# Patient Record
Sex: Male | Born: 1955 | Race: White | Hispanic: No | Marital: Single | Smoking: Never smoker
Health system: Southern US, Community
[De-identification: ages and names within clinical notes are randomized; demographics above are authoritative.]

## PROBLEM LIST (undated history)

## (undated) DIAGNOSIS — G8250 Quadriplegia, unspecified: Secondary | ICD-10-CM

## (undated) DIAGNOSIS — E46 Unspecified protein-calorie malnutrition: Secondary | ICD-10-CM

## (undated) DIAGNOSIS — E119 Type 2 diabetes mellitus without complications: Secondary | ICD-10-CM

## (undated) DIAGNOSIS — I10 Essential (primary) hypertension: Secondary | ICD-10-CM

## (undated) DIAGNOSIS — R601 Generalized edema: Secondary | ICD-10-CM

## (undated) DIAGNOSIS — I509 Heart failure, unspecified: Secondary | ICD-10-CM

## (undated) DIAGNOSIS — E785 Hyperlipidemia, unspecified: Secondary | ICD-10-CM

## (undated) DIAGNOSIS — J9690 Respiratory failure, unspecified, unspecified whether with hypoxia or hypercapnia: Secondary | ICD-10-CM

## (undated) HISTORY — DX: Essential (primary) hypertension: I10

## (undated) HISTORY — DX: Unspecified protein-calorie malnutrition: E46

## (undated) HISTORY — DX: Quadriplegia, unspecified: G82.50

## (undated) HISTORY — DX: Hyperlipidemia, unspecified: E78.5

## (undated) HISTORY — DX: Type 2 diabetes mellitus without complications: E11.9

## (undated) HISTORY — DX: Heart failure, unspecified: I50.9

## (undated) HISTORY — DX: Respiratory failure, unspecified, unspecified whether with hypoxia or hypercapnia: J96.90

## (undated) HISTORY — DX: Generalized edema: R60.1

---

## 2015-03-14 DIAGNOSIS — N401 Enlarged prostate with lower urinary tract symptoms: Secondary | ICD-10-CM | POA: Diagnosis not present

## 2015-03-14 DIAGNOSIS — E119 Type 2 diabetes mellitus without complications: Secondary | ICD-10-CM | POA: Diagnosis not present

## 2015-03-14 DIAGNOSIS — E785 Hyperlipidemia, unspecified: Secondary | ICD-10-CM | POA: Diagnosis not present

## 2015-03-14 DIAGNOSIS — E1142 Type 2 diabetes mellitus with diabetic polyneuropathy: Secondary | ICD-10-CM | POA: Diagnosis not present

## 2015-03-14 DIAGNOSIS — I10 Essential (primary) hypertension: Secondary | ICD-10-CM | POA: Diagnosis not present

## 2015-06-09 DIAGNOSIS — R21 Rash and other nonspecific skin eruption: Secondary | ICD-10-CM | POA: Diagnosis not present

## 2015-06-09 DIAGNOSIS — R0600 Dyspnea, unspecified: Secondary | ICD-10-CM | POA: Diagnosis not present

## 2015-06-09 DIAGNOSIS — M7989 Other specified soft tissue disorders: Secondary | ICD-10-CM | POA: Diagnosis not present

## 2015-06-09 DIAGNOSIS — L03116 Cellulitis of left lower limb: Secondary | ICD-10-CM | POA: Diagnosis not present

## 2015-06-09 DIAGNOSIS — M79662 Pain in left lower leg: Secondary | ICD-10-CM | POA: Diagnosis not present

## 2015-06-16 DIAGNOSIS — R609 Edema, unspecified: Secondary | ICD-10-CM | POA: Diagnosis not present

## 2015-06-16 DIAGNOSIS — I10 Essential (primary) hypertension: Secondary | ICD-10-CM | POA: Diagnosis not present

## 2015-06-16 DIAGNOSIS — L03116 Cellulitis of left lower limb: Secondary | ICD-10-CM | POA: Diagnosis not present

## 2015-06-16 DIAGNOSIS — E1142 Type 2 diabetes mellitus with diabetic polyneuropathy: Secondary | ICD-10-CM | POA: Diagnosis not present

## 2015-06-16 DIAGNOSIS — E119 Type 2 diabetes mellitus without complications: Secondary | ICD-10-CM | POA: Diagnosis not present

## 2015-06-23 DIAGNOSIS — L03116 Cellulitis of left lower limb: Secondary | ICD-10-CM | POA: Diagnosis not present

## 2015-06-23 DIAGNOSIS — I10 Essential (primary) hypertension: Secondary | ICD-10-CM | POA: Diagnosis not present

## 2015-06-23 DIAGNOSIS — E119 Type 2 diabetes mellitus without complications: Secondary | ICD-10-CM | POA: Diagnosis not present

## 2015-06-23 DIAGNOSIS — R609 Edema, unspecified: Secondary | ICD-10-CM | POA: Diagnosis not present

## 2015-07-01 DIAGNOSIS — R609 Edema, unspecified: Secondary | ICD-10-CM | POA: Diagnosis not present

## 2015-07-01 DIAGNOSIS — I10 Essential (primary) hypertension: Secondary | ICD-10-CM | POA: Diagnosis not present

## 2015-07-01 DIAGNOSIS — E119 Type 2 diabetes mellitus without complications: Secondary | ICD-10-CM | POA: Diagnosis not present

## 2015-07-01 DIAGNOSIS — F0391 Unspecified dementia with behavioral disturbance: Secondary | ICD-10-CM | POA: Diagnosis not present

## 2015-07-10 DIAGNOSIS — E1165 Type 2 diabetes mellitus with hyperglycemia: Secondary | ICD-10-CM | POA: Diagnosis not present

## 2015-07-10 DIAGNOSIS — F0391 Unspecified dementia with behavioral disturbance: Secondary | ICD-10-CM | POA: Diagnosis not present

## 2015-07-10 DIAGNOSIS — I10 Essential (primary) hypertension: Secondary | ICD-10-CM | POA: Diagnosis not present

## 2015-07-10 DIAGNOSIS — L03116 Cellulitis of left lower limb: Secondary | ICD-10-CM | POA: Diagnosis not present

## 2015-07-11 DIAGNOSIS — M79605 Pain in left leg: Secondary | ICD-10-CM | POA: Diagnosis not present

## 2015-07-11 DIAGNOSIS — I82402 Acute embolism and thrombosis of unspecified deep veins of left lower extremity: Secondary | ICD-10-CM | POA: Diagnosis not present

## 2015-07-11 DIAGNOSIS — M7989 Other specified soft tissue disorders: Secondary | ICD-10-CM | POA: Diagnosis not present

## 2015-07-16 DIAGNOSIS — R609 Edema, unspecified: Secondary | ICD-10-CM | POA: Diagnosis not present

## 2015-07-16 DIAGNOSIS — F0391 Unspecified dementia with behavioral disturbance: Secondary | ICD-10-CM | POA: Diagnosis not present

## 2015-07-16 DIAGNOSIS — M6281 Muscle weakness (generalized): Secondary | ICD-10-CM | POA: Diagnosis not present

## 2015-07-16 DIAGNOSIS — E1142 Type 2 diabetes mellitus with diabetic polyneuropathy: Secondary | ICD-10-CM | POA: Diagnosis not present

## 2015-07-16 DIAGNOSIS — R2689 Other abnormalities of gait and mobility: Secondary | ICD-10-CM | POA: Diagnosis not present

## 2015-07-16 DIAGNOSIS — E119 Type 2 diabetes mellitus without complications: Secondary | ICD-10-CM | POA: Diagnosis not present

## 2015-07-16 DIAGNOSIS — R279 Unspecified lack of coordination: Secondary | ICD-10-CM | POA: Diagnosis not present

## 2015-07-22 DIAGNOSIS — E1165 Type 2 diabetes mellitus with hyperglycemia: Secondary | ICD-10-CM | POA: Diagnosis not present

## 2015-07-22 DIAGNOSIS — F0391 Unspecified dementia with behavioral disturbance: Secondary | ICD-10-CM | POA: Diagnosis not present

## 2015-07-22 DIAGNOSIS — E1142 Type 2 diabetes mellitus with diabetic polyneuropathy: Secondary | ICD-10-CM | POA: Diagnosis not present

## 2015-07-22 DIAGNOSIS — I10 Essential (primary) hypertension: Secondary | ICD-10-CM | POA: Diagnosis not present

## 2015-07-29 DIAGNOSIS — F0391 Unspecified dementia with behavioral disturbance: Secondary | ICD-10-CM | POA: Diagnosis not present

## 2015-07-29 DIAGNOSIS — E1142 Type 2 diabetes mellitus with diabetic polyneuropathy: Secondary | ICD-10-CM | POA: Diagnosis not present

## 2015-07-29 DIAGNOSIS — M6281 Muscle weakness (generalized): Secondary | ICD-10-CM | POA: Diagnosis not present

## 2015-07-29 DIAGNOSIS — R279 Unspecified lack of coordination: Secondary | ICD-10-CM | POA: Diagnosis not present

## 2015-07-29 DIAGNOSIS — E119 Type 2 diabetes mellitus without complications: Secondary | ICD-10-CM | POA: Diagnosis not present

## 2015-07-29 DIAGNOSIS — R609 Edema, unspecified: Secondary | ICD-10-CM | POA: Diagnosis not present

## 2015-07-29 DIAGNOSIS — R2689 Other abnormalities of gait and mobility: Secondary | ICD-10-CM | POA: Diagnosis not present

## 2015-07-31 DIAGNOSIS — R2689 Other abnormalities of gait and mobility: Secondary | ICD-10-CM | POA: Diagnosis not present

## 2015-07-31 DIAGNOSIS — I10 Essential (primary) hypertension: Secondary | ICD-10-CM | POA: Diagnosis not present

## 2015-07-31 DIAGNOSIS — R609 Edema, unspecified: Secondary | ICD-10-CM | POA: Diagnosis not present

## 2015-07-31 DIAGNOSIS — F419 Anxiety disorder, unspecified: Secondary | ICD-10-CM | POA: Diagnosis not present

## 2015-07-31 DIAGNOSIS — E119 Type 2 diabetes mellitus without complications: Secondary | ICD-10-CM | POA: Diagnosis not present

## 2015-07-31 DIAGNOSIS — F0391 Unspecified dementia with behavioral disturbance: Secondary | ICD-10-CM | POA: Diagnosis not present

## 2015-07-31 DIAGNOSIS — M1990 Unspecified osteoarthritis, unspecified site: Secondary | ICD-10-CM | POA: Diagnosis not present

## 2015-07-31 DIAGNOSIS — E1142 Type 2 diabetes mellitus with diabetic polyneuropathy: Secondary | ICD-10-CM | POA: Diagnosis not present

## 2015-07-31 DIAGNOSIS — Z7984 Long term (current) use of oral hypoglycemic drugs: Secondary | ICD-10-CM | POA: Diagnosis not present

## 2015-07-31 DIAGNOSIS — M6281 Muscle weakness (generalized): Secondary | ICD-10-CM | POA: Diagnosis not present

## 2015-08-01 DIAGNOSIS — M6281 Muscle weakness (generalized): Secondary | ICD-10-CM | POA: Diagnosis not present

## 2015-08-01 DIAGNOSIS — I10 Essential (primary) hypertension: Secondary | ICD-10-CM | POA: Diagnosis not present

## 2015-08-01 DIAGNOSIS — E1142 Type 2 diabetes mellitus with diabetic polyneuropathy: Secondary | ICD-10-CM | POA: Diagnosis not present

## 2015-08-01 DIAGNOSIS — F0391 Unspecified dementia with behavioral disturbance: Secondary | ICD-10-CM | POA: Diagnosis not present

## 2015-08-01 DIAGNOSIS — Z7984 Long term (current) use of oral hypoglycemic drugs: Secondary | ICD-10-CM | POA: Diagnosis not present

## 2015-08-01 DIAGNOSIS — R2689 Other abnormalities of gait and mobility: Secondary | ICD-10-CM | POA: Diagnosis not present

## 2015-08-01 DIAGNOSIS — F419 Anxiety disorder, unspecified: Secondary | ICD-10-CM | POA: Diagnosis not present

## 2015-08-01 DIAGNOSIS — M1990 Unspecified osteoarthritis, unspecified site: Secondary | ICD-10-CM | POA: Diagnosis not present

## 2015-08-01 DIAGNOSIS — E119 Type 2 diabetes mellitus without complications: Secondary | ICD-10-CM | POA: Diagnosis not present

## 2015-08-01 DIAGNOSIS — R609 Edema, unspecified: Secondary | ICD-10-CM | POA: Diagnosis not present

## 2015-08-13 DIAGNOSIS — M6281 Muscle weakness (generalized): Secondary | ICD-10-CM | POA: Diagnosis not present

## 2015-08-13 DIAGNOSIS — R609 Edema, unspecified: Secondary | ICD-10-CM | POA: Diagnosis not present

## 2015-08-13 DIAGNOSIS — F419 Anxiety disorder, unspecified: Secondary | ICD-10-CM | POA: Diagnosis not present

## 2015-08-13 DIAGNOSIS — M1990 Unspecified osteoarthritis, unspecified site: Secondary | ICD-10-CM | POA: Diagnosis not present

## 2015-08-13 DIAGNOSIS — Z7984 Long term (current) use of oral hypoglycemic drugs: Secondary | ICD-10-CM | POA: Diagnosis not present

## 2015-08-13 DIAGNOSIS — R2689 Other abnormalities of gait and mobility: Secondary | ICD-10-CM | POA: Diagnosis not present

## 2015-08-13 DIAGNOSIS — F0391 Unspecified dementia with behavioral disturbance: Secondary | ICD-10-CM | POA: Diagnosis not present

## 2015-08-13 DIAGNOSIS — I10 Essential (primary) hypertension: Secondary | ICD-10-CM | POA: Diagnosis not present

## 2015-08-13 DIAGNOSIS — E1142 Type 2 diabetes mellitus with diabetic polyneuropathy: Secondary | ICD-10-CM | POA: Diagnosis not present

## 2015-08-13 DIAGNOSIS — E119 Type 2 diabetes mellitus without complications: Secondary | ICD-10-CM | POA: Diagnosis not present

## 2015-08-18 DIAGNOSIS — Z0001 Encounter for general adult medical examination with abnormal findings: Secondary | ICD-10-CM | POA: Diagnosis not present

## 2015-08-18 DIAGNOSIS — E1142 Type 2 diabetes mellitus with diabetic polyneuropathy: Secondary | ICD-10-CM | POA: Diagnosis not present

## 2015-08-18 DIAGNOSIS — Z1211 Encounter for screening for malignant neoplasm of colon: Secondary | ICD-10-CM | POA: Diagnosis not present

## 2015-08-18 DIAGNOSIS — E119 Type 2 diabetes mellitus without complications: Secondary | ICD-10-CM | POA: Diagnosis not present

## 2015-08-18 DIAGNOSIS — R450 Nervousness: Secondary | ICD-10-CM | POA: Diagnosis not present

## 2015-08-18 DIAGNOSIS — I10 Essential (primary) hypertension: Secondary | ICD-10-CM | POA: Diagnosis not present

## 2015-08-18 DIAGNOSIS — F329 Major depressive disorder, single episode, unspecified: Secondary | ICD-10-CM | POA: Diagnosis not present

## 2015-08-29 DIAGNOSIS — I10 Essential (primary) hypertension: Secondary | ICD-10-CM | POA: Diagnosis not present

## 2015-08-29 DIAGNOSIS — Z008 Encounter for other general examination: Secondary | ICD-10-CM | POA: Diagnosis not present

## 2015-08-29 DIAGNOSIS — E1142 Type 2 diabetes mellitus with diabetic polyneuropathy: Secondary | ICD-10-CM | POA: Diagnosis not present

## 2015-08-29 DIAGNOSIS — F0391 Unspecified dementia with behavioral disturbance: Secondary | ICD-10-CM | POA: Diagnosis not present

## 2015-08-29 DIAGNOSIS — E119 Type 2 diabetes mellitus without complications: Secondary | ICD-10-CM | POA: Diagnosis not present

## 2015-09-01 DIAGNOSIS — Z7984 Long term (current) use of oral hypoglycemic drugs: Secondary | ICD-10-CM | POA: Diagnosis not present

## 2015-09-01 DIAGNOSIS — F0391 Unspecified dementia with behavioral disturbance: Secondary | ICD-10-CM | POA: Diagnosis not present

## 2015-09-01 DIAGNOSIS — R2689 Other abnormalities of gait and mobility: Secondary | ICD-10-CM | POA: Diagnosis not present

## 2015-09-01 DIAGNOSIS — R609 Edema, unspecified: Secondary | ICD-10-CM | POA: Diagnosis not present

## 2015-09-01 DIAGNOSIS — E1142 Type 2 diabetes mellitus with diabetic polyneuropathy: Secondary | ICD-10-CM | POA: Diagnosis not present

## 2015-09-01 DIAGNOSIS — I10 Essential (primary) hypertension: Secondary | ICD-10-CM | POA: Diagnosis not present

## 2015-09-01 DIAGNOSIS — E119 Type 2 diabetes mellitus without complications: Secondary | ICD-10-CM | POA: Diagnosis not present

## 2015-09-01 DIAGNOSIS — M1990 Unspecified osteoarthritis, unspecified site: Secondary | ICD-10-CM | POA: Diagnosis not present

## 2015-09-01 DIAGNOSIS — F419 Anxiety disorder, unspecified: Secondary | ICD-10-CM | POA: Diagnosis not present

## 2015-09-01 DIAGNOSIS — M6281 Muscle weakness (generalized): Secondary | ICD-10-CM | POA: Diagnosis not present

## 2015-09-03 DIAGNOSIS — F0391 Unspecified dementia with behavioral disturbance: Secondary | ICD-10-CM | POA: Diagnosis not present

## 2015-09-03 DIAGNOSIS — Z7984 Long term (current) use of oral hypoglycemic drugs: Secondary | ICD-10-CM | POA: Diagnosis not present

## 2015-09-03 DIAGNOSIS — E119 Type 2 diabetes mellitus without complications: Secondary | ICD-10-CM | POA: Diagnosis not present

## 2015-09-03 DIAGNOSIS — I10 Essential (primary) hypertension: Secondary | ICD-10-CM | POA: Diagnosis not present

## 2015-09-03 DIAGNOSIS — F419 Anxiety disorder, unspecified: Secondary | ICD-10-CM | POA: Diagnosis not present

## 2015-09-03 DIAGNOSIS — R609 Edema, unspecified: Secondary | ICD-10-CM | POA: Diagnosis not present

## 2015-09-03 DIAGNOSIS — R2689 Other abnormalities of gait and mobility: Secondary | ICD-10-CM | POA: Diagnosis not present

## 2015-09-03 DIAGNOSIS — E1142 Type 2 diabetes mellitus with diabetic polyneuropathy: Secondary | ICD-10-CM | POA: Diagnosis not present

## 2015-09-03 DIAGNOSIS — M1990 Unspecified osteoarthritis, unspecified site: Secondary | ICD-10-CM | POA: Diagnosis not present

## 2015-09-03 DIAGNOSIS — M6281 Muscle weakness (generalized): Secondary | ICD-10-CM | POA: Diagnosis not present

## 2015-10-21 DIAGNOSIS — R1030 Lower abdominal pain, unspecified: Secondary | ICD-10-CM | POA: Diagnosis not present

## 2015-10-21 DIAGNOSIS — E119 Type 2 diabetes mellitus without complications: Secondary | ICD-10-CM | POA: Diagnosis not present

## 2015-10-21 DIAGNOSIS — R110 Nausea: Secondary | ICD-10-CM | POA: Diagnosis not present

## 2015-10-21 DIAGNOSIS — E559 Vitamin D deficiency, unspecified: Secondary | ICD-10-CM | POA: Diagnosis not present

## 2015-10-21 DIAGNOSIS — R1084 Generalized abdominal pain: Secondary | ICD-10-CM | POA: Diagnosis not present

## 2015-10-21 DIAGNOSIS — E785 Hyperlipidemia, unspecified: Secondary | ICD-10-CM | POA: Diagnosis not present

## 2015-10-21 DIAGNOSIS — E860 Dehydration: Secondary | ICD-10-CM | POA: Diagnosis not present

## 2015-10-21 DIAGNOSIS — I10 Essential (primary) hypertension: Secondary | ICD-10-CM | POA: Diagnosis not present

## 2015-10-21 DIAGNOSIS — R5381 Other malaise: Secondary | ICD-10-CM | POA: Diagnosis not present

## 2015-11-21 DIAGNOSIS — E119 Type 2 diabetes mellitus without complications: Secondary | ICD-10-CM | POA: Diagnosis not present

## 2015-11-21 DIAGNOSIS — S91002S Unspecified open wound, left ankle, sequela: Secondary | ICD-10-CM | POA: Diagnosis not present

## 2015-11-21 DIAGNOSIS — E860 Dehydration: Secondary | ICD-10-CM | POA: Diagnosis not present

## 2015-11-21 DIAGNOSIS — I10 Essential (primary) hypertension: Secondary | ICD-10-CM | POA: Diagnosis not present

## 2015-11-25 DIAGNOSIS — E1142 Type 2 diabetes mellitus with diabetic polyneuropathy: Secondary | ICD-10-CM | POA: Diagnosis not present

## 2015-11-25 DIAGNOSIS — E119 Type 2 diabetes mellitus without complications: Secondary | ICD-10-CM | POA: Diagnosis not present

## 2015-11-25 DIAGNOSIS — I10 Essential (primary) hypertension: Secondary | ICD-10-CM | POA: Diagnosis not present

## 2015-11-25 DIAGNOSIS — R5381 Other malaise: Secondary | ICD-10-CM | POA: Diagnosis not present

## 2015-12-03 DIAGNOSIS — M6281 Muscle weakness (generalized): Secondary | ICD-10-CM | POA: Diagnosis not present

## 2015-12-03 DIAGNOSIS — E669 Obesity, unspecified: Secondary | ICD-10-CM | POA: Diagnosis not present

## 2015-12-03 DIAGNOSIS — S81812D Laceration without foreign body, left lower leg, subsequent encounter: Secondary | ICD-10-CM | POA: Diagnosis not present

## 2015-12-03 DIAGNOSIS — E119 Type 2 diabetes mellitus without complications: Secondary | ICD-10-CM | POA: Diagnosis not present

## 2015-12-03 DIAGNOSIS — F0390 Unspecified dementia without behavioral disturbance: Secondary | ICD-10-CM | POA: Diagnosis not present

## 2015-12-03 DIAGNOSIS — R609 Edema, unspecified: Secondary | ICD-10-CM | POA: Diagnosis not present

## 2015-12-03 DIAGNOSIS — I10 Essential (primary) hypertension: Secondary | ICD-10-CM | POA: Diagnosis not present

## 2015-12-03 DIAGNOSIS — Z794 Long term (current) use of insulin: Secondary | ICD-10-CM | POA: Diagnosis not present

## 2015-12-04 DIAGNOSIS — Z794 Long term (current) use of insulin: Secondary | ICD-10-CM | POA: Diagnosis not present

## 2015-12-04 DIAGNOSIS — E119 Type 2 diabetes mellitus without complications: Secondary | ICD-10-CM | POA: Diagnosis not present

## 2015-12-04 DIAGNOSIS — I10 Essential (primary) hypertension: Secondary | ICD-10-CM | POA: Diagnosis not present

## 2015-12-04 DIAGNOSIS — R609 Edema, unspecified: Secondary | ICD-10-CM | POA: Diagnosis not present

## 2015-12-04 DIAGNOSIS — M6281 Muscle weakness (generalized): Secondary | ICD-10-CM | POA: Diagnosis not present

## 2015-12-04 DIAGNOSIS — S81812D Laceration without foreign body, left lower leg, subsequent encounter: Secondary | ICD-10-CM | POA: Diagnosis not present

## 2015-12-04 DIAGNOSIS — E669 Obesity, unspecified: Secondary | ICD-10-CM | POA: Diagnosis not present

## 2015-12-04 DIAGNOSIS — F0390 Unspecified dementia without behavioral disturbance: Secondary | ICD-10-CM | POA: Diagnosis not present

## 2015-12-19 DIAGNOSIS — E119 Type 2 diabetes mellitus without complications: Secondary | ICD-10-CM | POA: Diagnosis not present

## 2015-12-19 DIAGNOSIS — Z202 Contact with and (suspected) exposure to infections with a predominantly sexual mode of transmission: Secondary | ICD-10-CM | POA: Diagnosis not present

## 2015-12-19 DIAGNOSIS — I10 Essential (primary) hypertension: Secondary | ICD-10-CM | POA: Diagnosis not present

## 2015-12-19 DIAGNOSIS — E785 Hyperlipidemia, unspecified: Secondary | ICD-10-CM | POA: Diagnosis not present

## 2015-12-19 DIAGNOSIS — E1142 Type 2 diabetes mellitus with diabetic polyneuropathy: Secondary | ICD-10-CM | POA: Diagnosis not present

## 2015-12-19 DIAGNOSIS — R5381 Other malaise: Secondary | ICD-10-CM | POA: Diagnosis not present

## 2015-12-24 DIAGNOSIS — R197 Diarrhea, unspecified: Secondary | ICD-10-CM | POA: Diagnosis not present

## 2016-01-05 DIAGNOSIS — E669 Obesity, unspecified: Secondary | ICD-10-CM | POA: Diagnosis not present

## 2016-01-05 DIAGNOSIS — E119 Type 2 diabetes mellitus without complications: Secondary | ICD-10-CM | POA: Diagnosis not present

## 2016-01-05 DIAGNOSIS — Z794 Long term (current) use of insulin: Secondary | ICD-10-CM | POA: Diagnosis not present

## 2016-01-05 DIAGNOSIS — I10 Essential (primary) hypertension: Secondary | ICD-10-CM | POA: Diagnosis not present

## 2016-01-05 DIAGNOSIS — M6281 Muscle weakness (generalized): Secondary | ICD-10-CM | POA: Diagnosis not present

## 2016-01-05 DIAGNOSIS — F0390 Unspecified dementia without behavioral disturbance: Secondary | ICD-10-CM | POA: Diagnosis not present

## 2016-01-05 DIAGNOSIS — S81812D Laceration without foreign body, left lower leg, subsequent encounter: Secondary | ICD-10-CM | POA: Diagnosis not present

## 2016-01-05 DIAGNOSIS — R609 Edema, unspecified: Secondary | ICD-10-CM | POA: Diagnosis not present

## 2016-02-25 DIAGNOSIS — E1142 Type 2 diabetes mellitus with diabetic polyneuropathy: Secondary | ICD-10-CM | POA: Diagnosis not present

## 2016-02-25 DIAGNOSIS — E785 Hyperlipidemia, unspecified: Secondary | ICD-10-CM | POA: Diagnosis not present

## 2016-02-25 DIAGNOSIS — E119 Type 2 diabetes mellitus without complications: Secondary | ICD-10-CM | POA: Diagnosis not present

## 2016-02-25 DIAGNOSIS — Z23 Encounter for immunization: Secondary | ICD-10-CM | POA: Diagnosis not present

## 2016-02-25 DIAGNOSIS — I10 Essential (primary) hypertension: Secondary | ICD-10-CM | POA: Diagnosis not present

## 2016-12-14 ENCOUNTER — Other Ambulatory Visit (HOSPITAL_COMMUNITY): Payer: Medicaid Other

## 2016-12-14 ENCOUNTER — Inpatient Hospital Stay
Admission: RE | Admit: 2016-12-14 | Discharge: 2017-01-05 | Disposition: A | Discharge status: Skilled Nursing Facility | Payer: Medicaid Other | Source: Other Acute Inpatient Hospital | Attending: Internal Medicine | Admitting: Internal Medicine

## 2016-12-14 DIAGNOSIS — Z4659 Encounter for fitting and adjustment of other gastrointestinal appliance and device: Secondary | ICD-10-CM

## 2016-12-14 DIAGNOSIS — Z931 Gastrostomy status: Secondary | ICD-10-CM

## 2016-12-14 DIAGNOSIS — Z0189 Encounter for other specified special examinations: Secondary | ICD-10-CM

## 2016-12-14 DIAGNOSIS — J9690 Respiratory failure, unspecified, unspecified whether with hypoxia or hypercapnia: Secondary | ICD-10-CM

## 2016-12-14 LAB — MAGNESIUM: MAGNESIUM: 2.4 mg/dL (ref 1.7–2.4)

## 2016-12-15 LAB — COMPREHENSIVE METABOLIC PANEL
ALT: 24 U/L (ref 17–63)
ANION GAP: 7 (ref 5–15)
AST: 21 U/L (ref 15–41)
Albumin: 2.8 g/dL — ABNORMAL LOW (ref 3.5–5.0)
Alkaline Phosphatase: 93 U/L (ref 38–126)
BUN: 39 mg/dL — ABNORMAL HIGH (ref 6–20)
CHLORIDE: 100 mmol/L — AB (ref 101–111)
CO2: 30 mmol/L (ref 22–32)
CREATININE: 1.43 mg/dL — AB (ref 0.61–1.24)
Calcium: 9.2 mg/dL (ref 8.9–10.3)
GFR, EST AFRICAN AMERICAN: 59 mL/min — AB (ref >60)
GFR, EST NON AFRICAN AMERICAN: 51 mL/min — AB (ref >60)
Glucose, Bld: 144 mg/dL — ABNORMAL HIGH (ref 65–99)
POTASSIUM: 4.6 mmol/L (ref 3.5–5.1)
SODIUM: 137 mmol/L (ref 135–145)
Total Bilirubin: 0.6 mg/dL (ref 0.3–1.2)
Total Protein: 6.5 g/dL (ref 6.5–8.1)

## 2016-12-15 LAB — PHOSPHORUS: PHOSPHORUS: 3.8 mg/dL (ref 2.5–4.6)

## 2016-12-15 LAB — CBC WITH DIFFERENTIAL/PLATELET
BASOS PCT: 1 %
Basophils Absolute: 0 10*3/uL (ref 0.0–0.1)
EOS ABS: 0.4 10*3/uL (ref 0.0–0.7)
EOS PCT: 4 %
HCT: 36.3 % — ABNORMAL LOW (ref 39.0–52.0)
Hemoglobin: 11.7 g/dL — ABNORMAL LOW (ref 13.0–17.0)
LYMPHS ABS: 2.3 10*3/uL (ref 0.7–4.0)
Lymphocytes Relative: 28 %
MCH: 26.7 pg (ref 26.0–34.0)
MCHC: 32.2 g/dL (ref 30.0–36.0)
MCV: 82.7 fL (ref 78.0–100.0)
Monocytes Absolute: 0.6 10*3/uL (ref 0.1–1.0)
Monocytes Relative: 8 %
NEUTROS PCT: 59 %
Neutro Abs: 5.1 10*3/uL (ref 1.7–7.7)
PLATELETS: 291 10*3/uL (ref 150–400)
RBC: 4.39 MIL/uL (ref 4.22–5.81)
RDW: 15.1 % (ref 11.5–15.5)
WBC: 8.5 10*3/uL (ref 4.0–10.5)

## 2016-12-15 LAB — TSH: TSH: 1.557 u[IU]/mL (ref 0.350–4.500)

## 2016-12-15 LAB — HEMOGLOBIN A1C
HEMOGLOBIN A1C: 6.9 % — AB (ref 4.8–5.6)
MEAN PLASMA GLUCOSE: 151.33 mg/dL

## 2016-12-15 LAB — PROTIME-INR
INR: 1.05
PROTHROMBIN TIME: 13.6 s (ref 11.4–15.2)

## 2016-12-16 ENCOUNTER — Other Ambulatory Visit (HOSPITAL_COMMUNITY): Payer: Medicaid Other

## 2016-12-16 LAB — RENAL FUNCTION PANEL
ANION GAP: 8 (ref 5–15)
Albumin: 2.8 g/dL — ABNORMAL LOW (ref 3.5–5.0)
BUN: 45 mg/dL — ABNORMAL HIGH (ref 6–20)
CALCIUM: 8.9 mg/dL (ref 8.9–10.3)
CO2: 31 mmol/L (ref 22–32)
CREATININE: 1.58 mg/dL — AB (ref 0.61–1.24)
Chloride: 96 mmol/L — ABNORMAL LOW (ref 101–111)
GFR, EST AFRICAN AMERICAN: 52 mL/min — AB (ref >60)
GFR, EST NON AFRICAN AMERICAN: 45 mL/min — AB (ref >60)
Glucose, Bld: 176 mg/dL — ABNORMAL HIGH (ref 65–99)
Phosphorus: 5.9 mg/dL — ABNORMAL HIGH (ref 2.5–4.6)
Potassium: 4.9 mmol/L (ref 3.5–5.1)
SODIUM: 135 mmol/L (ref 135–145)

## 2016-12-16 LAB — CBC
HEMATOCRIT: 37.4 % — AB (ref 39.0–52.0)
HEMOGLOBIN: 12.1 g/dL — AB (ref 13.0–17.0)
MCH: 27.2 pg (ref 26.0–34.0)
MCHC: 32.4 g/dL (ref 30.0–36.0)
MCV: 84 fL (ref 78.0–100.0)
Platelets: 250 10*3/uL (ref 150–400)
RBC: 4.45 MIL/uL (ref 4.22–5.81)
RDW: 15.1 % (ref 11.5–15.5)
WBC: 7.9 10*3/uL (ref 4.0–10.5)

## 2016-12-16 LAB — MAGNESIUM: MAGNESIUM: 2.6 mg/dL — AB (ref 1.7–2.4)

## 2016-12-17 ENCOUNTER — Other Ambulatory Visit (HOSPITAL_COMMUNITY): Payer: Medicaid Other

## 2016-12-17 ENCOUNTER — Encounter: Payer: Self-pay | Admitting: General Surgery

## 2016-12-17 NOTE — Consult Note (Signed)
Chief Complaint: dysphagia  Referring Physician:Dr. Merton Border  Supervising Physician: Daryll Brod  Patient Status: [facility]  HPI: Jordan Villanueva is a 61 y.o. male who was admitted to an outside facility secondary a fall out of the bed and landing on his face resulting in respiratory failure.  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  He has since developed dysphagia.  He has a history of super morbid obesity, DM, HTN, CKD.  He has been admitted to [facility] for rehabilitation.  He has still not passed his swallow evaluation and a request for a gastrostomy tube has been placed.  Past Medical History: Reviewed in paper chart  Past Surgical History: Reviewed in paper chart  Family History: [place]  Social History: Reviewed in paper chart  Allergies: NKDA per paper chart  Medications: Medications reviewed in paper chart  Please HPI for pertinent positives, otherwise he complains of upper extremity weakness, difficulty moving around, but all other systems are negative currently.  Mallampati Score: MD Evaluation Airway: WNL Heart: WNL Abdomen: Other (comments) Abdomen comments: super morbidly obese Chest/ Lungs: WNL ASA  Classification: 3 Mallampati/Airway Score: Four  Physical Exam: Vitals reviewed in paper chart  General: pleasant, super morbidly obese white male who is laying in bed in NAD HEENT: head is normocephalic, atraumatic.  Sclera are noninjected.  PERRL.  Ears and nose without any masses or lesions, but PANDA tube in place.  Mouth is pink and dry Heart: regular, rate, and rhythm.  Normal s1,s2. No obvious murmurs, gallops, or rubs noted.  Lungs: diffuse rhonchi noted.  Respiratory effort nonlabored at least 6L O2 Abd: soft, NT, super morbidly obese, +BS, no masses, hernias palpated Psych: Alert and able to answer some questions, but wife consents for him   Labs: Results for orders placed or  performed during the hospital encounter of 12/14/16 (from the past 48 hour(s))  CBC     Status: Abnormal   Collection Time: 12/16/16  7:09 AM  Result Value Ref Range   WBC 7.9 4.0 - 10.5 K/uL   RBC 4.45 4.22 - 5.81 MIL/uL   Hemoglobin 12.1 (L) 13.0 - 17.0 g/dL   HCT 37.4 (L) 39.0 - 52.0 %   MCV 84.0 78.0 - 100.0 fL   MCH 27.2 26.0 - 34.0 pg   MCHC 32.4 30.0 - 36.0 g/dL   RDW 15.1 11.5 - 15.5 %   Platelets 250 150 - 400 K/uL  Magnesium     Status: Abnormal   Collection Time: 12/16/16  7:09 AM  Result Value Ref Range   Magnesium 2.6 (H) 1.7 - 2.4 mg/dL  Renal function panel     Status: Abnormal   Collection Time: 12/16/16  7:09 AM  Result Value Ref Range   Sodium 135 135 - 145 mmol/L   Potassium 4.9 3.5 - 5.1 mmol/L   Chloride 96 (L) 101 - 111 mmol/L   CO2 31 22 - 32 mmol/L   Glucose, Bld 176 (H) 65 - 99 mg/dL   BUN 45 (H) 6 - 20 mg/dL   Creatinine, Ser 1.58 (H) 0.61 - 1.24 mg/dL   Calcium 8.9 8.9 - 10.3 mg/dL   Phosphorus 5.9 (H) 2.5 - 4.6 mg/dL   Albumin 2.8 (L) 3.5 - 5.0 g/dL   GFR calc non Af Amer 45 (L) >60 mL/min   GFR calc Af Amer 52 (L) >60 mL/min    Comment: (NOTE) The eGFR has  been calculated using the CKD EPI equation. This calculation has not been validated in all clinical situations. eGFR's persistently <60 mL/min signify possible Chronic Kidney Disease.    Anion gap 8 5 - 15    Imaging: Ct Abdomen Wo Contrast  Result Date: 12/17/2016 CLINICAL DATA:  Evaluate anatomy for potential percutaneous gastrostomy tube placement. EXAM: CT ABDOMEN WITHOUT CONTRAST TECHNIQUE: Multidetector CT imaging of the abdomen was performed following the standard protocol without IV contrast. COMPARISON:  CT abdomen pelvis - 01/07/2012 FINDINGS: The lack of intravenous contrast limits the ability to evaluate solid abdominal organs. Examination is further degraded secondary to patient body habitus and quantum mottle artifact. Lower chest: Minimal dependent subpleural atelectasis. No  discrete focal airspace opacities. Borderline cardiomegaly.  No pericardial effusion. Hepatobiliary: Normal hepatic contour. Post cholecystectomy. No ascites. Pancreas: Normal appearance of the pancreas Spleen: Normal appearance of the spleen. Note is made of a small splenule. Adrenals/Urinary Tract: Normal noncontrast appearance the bilateral kidneys. No renal stones. No urinary obstruction. Note is made of an approximately 3.4 x 3.0 cm hypoattenuating (-46 Hounsfield unit) left-sided adrenal myelolipoma, similar to the 12/2011 examination. Normal noncontrast appearance of the right adrenal gland. Stomach/Bowel: The anterior aspect of the mid body of the stomach is well apposed against the ventral wall of the abdomen without interposed liver or colon. Enteric tube tip terminates regional to the [place]. Apparent left lateral abdominal hernia containing portion of the descending colon (image 66, series 3), incompletely imaged. Nonobstructive bowel gas pattern. No pneumoperitoneum, pneumatosis or portal venous gas. Vascular/Lymphatic: Minimal amount of atherosclerotic plaque within a normal caliber abdominal aorta. No bulky retroperitoneal, mesenteric, pelvic or inguinal lymphadenopathy. Other: Left lateral abdominal wall hernia, incompletely evaluated. Musculoskeletal: Severe DDD of L-S1 with disc space height loss, endplate irregularity and sclerosis. 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 IMPRESSION: 1. Gastric anatomy amenable to attempted percutaneous gastrostomy tube placement as clinically indicated. 2. Suspected left lateral abdominal wall hernia containing a portion of the descending colon, incompletely imaged. No definite evidence of enteric obstruction. 3. Benign left adrenal myelolipoma, grossly unchanged compared to the 12/2011 examination. 4.  Aortic Atherosclerosis (ICD10-I70.0). Electronically Signed   By: Sandi Mariscal M.D.   On: 12/17/2016 07:47    Assessment/Plan 1. Dysphagia  secondary to cervical edema  We will plan to proceed with g-tube placement hopefully on Monday.  Labs and vitals have been reviewed.  Phone consent has been obtained from his wife Reg Bircher.    Risks and benefits discussed with the patient including, but not limited to the need for a barium enema during the procedure, bleeding, infection, peritonitis, or damage to adjacent structures. All of the patient's questions were answered, patient is agreeable to proceed. Consent signed and in chart.   Thank you for this interesting consult.  I greatly enjoyed meeting Sang [facility]ount and look forward to participating in their care.  A copy of this report was sent to the requesting provider on this date.  Electronically Signed: Henreitta Cea 12/17/2016, 10:42 AM   I spent a total of 40 Minutes    in face to face in clinical consultation, greater than 50% of which was counseling/coordinating care for dysphagia

## 2016-12-18 LAB — URINALYSIS, ROUTINE W REFLEX MICROSCOPIC
Bilirubin Urine: NEGATIVE
Glucose, UA: 50 mg/dL — AB
Hgb urine dipstick: NEGATIVE
Ketones, ur: NEGATIVE mg/dL
Nitrite: NEGATIVE
PH: 5 (ref 5.0–8.0)
Protein, ur: >=300 mg/dL — AB
SPECIFIC GRAVITY, URINE: 1.015 (ref 1.005–1.030)
SQUAMOUS EPITHELIAL / LPF: NONE SEEN

## 2016-12-19 LAB — BASIC METABOLIC PANEL
ANION GAP: 9 (ref 5–15)
BUN: 70 mg/dL — ABNORMAL HIGH (ref 6–20)
CALCIUM: 9.4 mg/dL (ref 8.9–10.3)
CO2: 31 mmol/L (ref 22–32)
Chloride: 98 mmol/L — ABNORMAL LOW (ref 101–111)
Creatinine, Ser: 1.53 mg/dL — ABNORMAL HIGH (ref 0.61–1.24)
GFR, EST AFRICAN AMERICAN: 54 mL/min — AB (ref >60)
GFR, EST NON AFRICAN AMERICAN: 47 mL/min — AB (ref >60)
GLUCOSE: 196 mg/dL — AB (ref 65–99)
Potassium: 4.3 mmol/L (ref 3.5–5.1)
Sodium: 138 mmol/L (ref 135–145)

## 2016-12-19 LAB — CBC
HCT: 37.6 % — ABNORMAL LOW (ref 39.0–52.0)
Hemoglobin: 12.3 g/dL — ABNORMAL LOW (ref 13.0–17.0)
MCH: 27.6 pg (ref 26.0–34.0)
MCHC: 32.7 g/dL (ref 30.0–36.0)
MCV: 84.5 fL (ref 78.0–100.0)
PLATELETS: 268 10*3/uL (ref 150–400)
RBC: 4.45 MIL/uL (ref 4.22–5.81)
RDW: 14.5 % (ref 11.5–15.5)
WBC: 9.2 10*3/uL (ref 4.0–10.5)

## 2016-12-19 LAB — PHOSPHORUS: Phosphorus: 4.5 mg/dL (ref 2.5–4.6)

## 2016-12-19 LAB — MAGNESIUM: Magnesium: 2.9 mg/dL — ABNORMAL HIGH (ref 1.7–2.4)

## 2016-12-20 ENCOUNTER — Encounter (HOSPITAL_COMMUNITY): Payer: Self-pay | Admitting: Interventional Radiology

## 2016-12-20 ENCOUNTER — Other Ambulatory Visit (HOSPITAL_COMMUNITY): Payer: Medicaid Other

## 2016-12-20 HISTORY — PX: IR GASTROSTOMY TUBE MOD SED: IMG625

## 2016-12-20 MED ORDER — IOPAMIDOL (ISOVUE-300) INJECTION 61%
INTRAVENOUS | Status: AC
  Administered 2016-12-20: 15 mL
  Filled 2016-12-20: qty 50

## 2016-12-20 MED ORDER — LIDOCAINE HCL (PF) 1 % IJ SOLN
INTRAMUSCULAR | Status: AC | PRN
  Administered 2016-12-20: 5 mL

## 2016-12-20 MED ORDER — MIDAZOLAM HCL 2 MG/2ML IJ SOLN
INTRAMUSCULAR | Status: AC
  Filled 2016-12-20: qty 2

## 2016-12-20 MED ORDER — LIDOCAINE HCL 1 % IJ SOLN
INTRAMUSCULAR | Status: AC
  Filled 2016-12-20: qty 20

## 2016-12-20 MED ORDER — FENTANYL CITRATE (PF) 100 MCG/2ML IJ SOLN
INTRAMUSCULAR | Status: AC | PRN
  Administered 2016-12-20: 25 ug via INTRAVENOUS

## 2016-12-20 MED ORDER — FENTANYL CITRATE (PF) 100 MCG/2ML IJ SOLN
INTRAMUSCULAR | Status: AC
  Filled 2016-12-20: qty 2

## 2016-12-20 MED ORDER — GLUCAGON HCL RDNA (DIAGNOSTIC) 1 MG IJ SOLR
INTRAMUSCULAR | Status: AC
  Filled 2016-12-20: qty 1

## 2016-12-20 MED ORDER — MIDAZOLAM HCL 2 MG/2ML IJ SOLN
INTRAMUSCULAR | Status: AC | PRN
  Administered 2016-12-20: 1 mg via INTRAVENOUS

## 2016-12-20 MED ORDER — CEFAZOLIN SODIUM-DEXTROSE 2-4 GM/100ML-% IV SOLN
INTRAVENOUS | Status: AC
  Administered 2016-12-20: 2000 mg
  Filled 2016-12-20: qty 100

## 2016-12-20 NOTE — Sedation Documentation (Signed)
Patient is resting comfortably. No complaints at this time 

## 2016-12-20 NOTE — Sedation Documentation (Signed)
Patient denies pain and is resting comfortably.  

## 2016-12-20 NOTE — Procedures (Signed)
Interventional Radiology Procedure Note  Procedure:  Gastrostomy tube placement  Complications:  None  Estimated Blood Loss:  < 10 mL  20 Fr bumper retention gastrostomy tube placed with tip in body of stomach.  OK to use in 24 hours.  Shannan Garfinkel T. Chad Tiznado, M.D Pager:  [phone]    

## 2016-12-20 NOTE — Sedation Documentation (Signed)
Vital signs stable. 

## 2016-12-21 LAB — CBC
HCT: 38.2 % — ABNORMAL LOW (ref 39.0–52.0)
Hemoglobin: 12.2 g/dL — ABNORMAL LOW (ref 13.0–17.0)
MCH: 27.4 pg (ref 26.0–34.0)
MCHC: 31.9 g/dL (ref 30.0–36.0)
MCV: 85.7 fL (ref 78.0–100.0)
PLATELETS: 282 10*3/uL (ref 150–400)
RBC: 4.46 MIL/uL (ref 4.22–5.81)
RDW: 15 % (ref 11.5–15.5)
WBC: 9.4 10*3/uL (ref 4.0–10.5)

## 2016-12-21 LAB — BASIC METABOLIC PANEL
ANION GAP: 7 (ref 5–15)
BUN: 56 mg/dL — AB (ref 6–20)
CALCIUM: 9.8 mg/dL (ref 8.9–10.3)
CO2: 33 mmol/L — ABNORMAL HIGH (ref 22–32)
Chloride: 102 mmol/L (ref 101–111)
Creatinine, Ser: 1.39 mg/dL — ABNORMAL HIGH (ref 0.61–1.24)
GFR calc Af Amer: >60 mL/min (ref >60)
GFR, EST NON AFRICAN AMERICAN: 52 mL/min — AB (ref >60)
GLUCOSE: 187 mg/dL — AB (ref 65–99)
Potassium: 4.3 mmol/L (ref 3.5–5.1)
SODIUM: 142 mmol/L (ref 135–145)

## 2016-12-21 LAB — PHOSPHORUS: Phosphorus: 3.6 mg/dL (ref 2.5–4.6)

## 2016-12-21 LAB — URINE CULTURE

## 2016-12-21 LAB — MAGNESIUM: MAGNESIUM: 2.5 mg/dL — AB (ref 1.7–2.4)

## 2016-12-24 LAB — BASIC METABOLIC PANEL
ANION GAP: 8 (ref 5–15)
BUN: 69 mg/dL — ABNORMAL HIGH (ref 6–20)
CO2: 33 mmol/L — AB (ref 22–32)
Calcium: 9.6 mg/dL (ref 8.9–10.3)
Chloride: 105 mmol/L (ref 101–111)
Creatinine, Ser: 1.66 mg/dL — ABNORMAL HIGH (ref 0.61–1.24)
GFR calc Af Amer: 49 mL/min — ABNORMAL LOW (ref >60)
GFR calc non Af Amer: 42 mL/min — ABNORMAL LOW (ref >60)
GLUCOSE: 180 mg/dL — AB (ref 65–99)
POTASSIUM: 4.4 mmol/L (ref 3.5–5.1)
Sodium: 146 mmol/L — ABNORMAL HIGH (ref 135–145)

## 2016-12-25 LAB — C DIFFICILE QUICK SCREEN W PCR REFLEX
C DIFFICILE (CDIFF) TOXIN: NEGATIVE
C DIFFICLE (CDIFF) ANTIGEN: NEGATIVE
C Diff interpretation: NOT DETECTED

## 2016-12-26 LAB — BASIC METABOLIC PANEL
ANION GAP: 10 (ref 5–15)
BUN: 63 mg/dL — ABNORMAL HIGH (ref 6–20)
CHLORIDE: 106 mmol/L (ref 101–111)
CO2: 31 mmol/L (ref 22–32)
CREATININE: 1.42 mg/dL — AB (ref 0.61–1.24)
Calcium: 9.7 mg/dL (ref 8.9–10.3)
GFR calc non Af Amer: 51 mL/min — ABNORMAL LOW (ref >60)
GFR, EST AFRICAN AMERICAN: 59 mL/min — AB (ref >60)
Glucose, Bld: 169 mg/dL — ABNORMAL HIGH (ref 65–99)
POTASSIUM: 4.1 mmol/L (ref 3.5–5.1)
Sodium: 147 mmol/L — ABNORMAL HIGH (ref 135–145)

## 2016-12-28 LAB — BASIC METABOLIC PANEL
Anion gap: 8 (ref 5–15)
BUN: 51 mg/dL — AB (ref 6–20)
CALCIUM: 9.8 mg/dL (ref 8.9–10.3)
CO2: 29 mmol/L (ref 22–32)
CREATININE: 1.27 mg/dL — AB (ref 0.61–1.24)
Chloride: 108 mmol/L (ref 101–111)
GFR, EST NON AFRICAN AMERICAN: 58 mL/min — AB (ref >60)
Glucose, Bld: 165 mg/dL — ABNORMAL HIGH (ref 65–99)
Potassium: 5.2 mmol/L — ABNORMAL HIGH (ref 3.5–5.1)
SODIUM: 145 mmol/L (ref 135–145)

## 2016-12-28 LAB — CBC
HCT: 37.1 % — ABNORMAL LOW (ref 39.0–52.0)
Hemoglobin: 11.8 g/dL — ABNORMAL LOW (ref 13.0–17.0)
MCH: 27.8 pg (ref 26.0–34.0)
MCHC: 31.8 g/dL (ref 30.0–36.0)
MCV: 87.5 fL (ref 78.0–100.0)
PLATELETS: 219 10*3/uL (ref 150–400)
RBC: 4.24 MIL/uL (ref 4.22–5.81)
RDW: 14.8 % (ref 11.5–15.5)
WBC: 8.8 10*3/uL (ref 4.0–10.5)

## 2016-12-28 LAB — PHOSPHORUS: PHOSPHORUS: 4 mg/dL (ref 2.5–4.6)

## 2016-12-28 LAB — MAGNESIUM: MAGNESIUM: 2.4 mg/dL (ref 1.7–2.4)

## 2016-12-29 LAB — BASIC METABOLIC PANEL
Anion gap: 7 (ref 5–15)
BUN: 52 mg/dL — ABNORMAL HIGH (ref 6–20)
CALCIUM: 9.7 mg/dL (ref 8.9–10.3)
CHLORIDE: 106 mmol/L (ref 101–111)
CO2: 32 mmol/L (ref 22–32)
CREATININE: 1.37 mg/dL — AB (ref 0.61–1.24)
GFR, EST NON AFRICAN AMERICAN: 53 mL/min — AB (ref >60)
Glucose, Bld: 137 mg/dL — ABNORMAL HIGH (ref 65–99)
Potassium: 4.8 mmol/L (ref 3.5–5.1)
SODIUM: 145 mmol/L (ref 135–145)

## 2017-01-04 LAB — CBC
HCT: 36.5 % — ABNORMAL LOW (ref 39.0–52.0)
Hemoglobin: 11.5 g/dL — ABNORMAL LOW (ref 13.0–17.0)
MCH: 27.7 pg (ref 26.0–34.0)
MCHC: 31.5 g/dL (ref 30.0–36.0)
MCV: 88 fL (ref 78.0–100.0)
PLATELETS: 219 10*3/uL (ref 150–400)
RBC: 4.15 MIL/uL — AB (ref 4.22–5.81)
RDW: 15.3 % (ref 11.5–15.5)
WBC: 8.5 10*3/uL (ref 4.0–10.5)

## 2017-01-04 LAB — BASIC METABOLIC PANEL
ANION GAP: 7 (ref 5–15)
BUN: 56 mg/dL — ABNORMAL HIGH (ref 6–20)
CO2: 31 mmol/L (ref 22–32)
Calcium: 10.2 mg/dL (ref 8.9–10.3)
Chloride: 106 mmol/L (ref 101–111)
Creatinine, Ser: 1.45 mg/dL — ABNORMAL HIGH (ref 0.61–1.24)
GFR calc Af Amer: 58 mL/min — ABNORMAL LOW (ref >60)
GFR, EST NON AFRICAN AMERICAN: 50 mL/min — AB (ref >60)
GLUCOSE: 170 mg/dL — AB (ref 65–99)
POTASSIUM: 4.6 mmol/L (ref 3.5–5.1)
SODIUM: 144 mmol/L (ref 135–145)

## 2017-01-06 ENCOUNTER — Encounter: Payer: Self-pay | Admitting: Adult Health

## 2017-01-06 ENCOUNTER — Non-Acute Institutional Stay (SKILLED_NURSING_FACILITY): Payer: Medicare Other | Admitting: Adult Health

## 2017-01-06 ENCOUNTER — Other Ambulatory Visit: Payer: Self-pay

## 2017-01-06 ENCOUNTER — Emergency Department (HOSPITAL_COMMUNITY): Payer: Medicare Other

## 2017-01-06 ENCOUNTER — Inpatient Hospital Stay (HOSPITAL_COMMUNITY)
Admission: EM | Admit: 2017-01-06 | Discharge: 2017-01-12 | DRG: 291 | Disposition: A | Discharge status: Skilled Nursing Facility | Payer: Medicare Other | Attending: Internal Medicine | Admitting: Internal Medicine

## 2017-01-06 ENCOUNTER — Encounter (HOSPITAL_COMMUNITY): Payer: Self-pay | Admitting: Emergency Medicine

## 2017-01-06 DIAGNOSIS — E1169 Type 2 diabetes mellitus with other specified complication: Secondary | ICD-10-CM

## 2017-01-06 DIAGNOSIS — E785 Hyperlipidemia, unspecified: Secondary | ICD-10-CM | POA: Diagnosis present

## 2017-01-06 DIAGNOSIS — H548 Legal blindness, as defined in USA: Secondary | ICD-10-CM | POA: Diagnosis present

## 2017-01-06 DIAGNOSIS — S24101A Unspecified injury at T1 level of thoracic spinal cord, initial encounter: Secondary | ICD-10-CM | POA: Diagnosis present

## 2017-01-06 DIAGNOSIS — N179 Acute kidney failure, unspecified: Secondary | ICD-10-CM | POA: Diagnosis present

## 2017-01-06 DIAGNOSIS — R601 Generalized edema: Secondary | ICD-10-CM

## 2017-01-06 DIAGNOSIS — I5032 Chronic diastolic (congestive) heart failure: Secondary | ICD-10-CM | POA: Diagnosis not present

## 2017-01-06 DIAGNOSIS — E46 Unspecified protein-calorie malnutrition: Secondary | ICD-10-CM | POA: Diagnosis present

## 2017-01-06 DIAGNOSIS — Z794 Long term (current) use of insulin: Secondary | ICD-10-CM

## 2017-01-06 DIAGNOSIS — N183 Chronic kidney disease, stage 3 (moderate): Secondary | ICD-10-CM | POA: Diagnosis present

## 2017-01-06 DIAGNOSIS — G8250 Quadriplegia, unspecified: Secondary | ICD-10-CM | POA: Diagnosis present

## 2017-01-06 DIAGNOSIS — E1122 Type 2 diabetes mellitus with diabetic chronic kidney disease: Secondary | ICD-10-CM | POA: Diagnosis present

## 2017-01-06 DIAGNOSIS — I130 Hypertensive heart and chronic kidney disease with heart failure and stage 1 through stage 4 chronic kidney disease, or unspecified chronic kidney disease: Secondary | ICD-10-CM | POA: Diagnosis not present

## 2017-01-06 DIAGNOSIS — R0602 Shortness of breath: Secondary | ICD-10-CM

## 2017-01-06 DIAGNOSIS — E669 Obesity, unspecified: Secondary | ICD-10-CM | POA: Diagnosis present

## 2017-01-06 DIAGNOSIS — Z981 Arthrodesis status: Secondary | ICD-10-CM

## 2017-01-06 DIAGNOSIS — E778 Other disorders of glycoprotein metabolism: Secondary | ICD-10-CM | POA: Diagnosis present

## 2017-01-06 DIAGNOSIS — R4702 Dysphasia: Secondary | ICD-10-CM | POA: Diagnosis present

## 2017-01-06 DIAGNOSIS — G9341 Metabolic encephalopathy: Secondary | ICD-10-CM | POA: Diagnosis present

## 2017-01-06 DIAGNOSIS — Z7982 Long term (current) use of aspirin: Secondary | ICD-10-CM

## 2017-01-06 DIAGNOSIS — E8770 Fluid overload, unspecified: Secondary | ICD-10-CM

## 2017-01-06 DIAGNOSIS — S24101S Unspecified injury at T1 level of thoracic spinal cord, sequela: Secondary | ICD-10-CM | POA: Diagnosis not present

## 2017-01-06 DIAGNOSIS — K219 Gastro-esophageal reflux disease without esophagitis: Secondary | ICD-10-CM | POA: Diagnosis not present

## 2017-01-06 DIAGNOSIS — F0390 Unspecified dementia without behavioral disturbance: Secondary | ICD-10-CM | POA: Diagnosis present

## 2017-01-06 DIAGNOSIS — Z79899 Other long term (current) drug therapy: Secondary | ICD-10-CM

## 2017-01-06 DIAGNOSIS — K5909 Other constipation: Secondary | ICD-10-CM | POA: Diagnosis not present

## 2017-01-06 DIAGNOSIS — F329 Major depressive disorder, single episode, unspecified: Secondary | ICD-10-CM | POA: Diagnosis present

## 2017-01-06 DIAGNOSIS — J9690 Respiratory failure, unspecified, unspecified whether with hypoxia or hypercapnia: Secondary | ICD-10-CM | POA: Diagnosis present

## 2017-01-06 DIAGNOSIS — I509 Heart failure, unspecified: Secondary | ICD-10-CM

## 2017-01-06 DIAGNOSIS — Z931 Gastrostomy status: Secondary | ICD-10-CM

## 2017-01-06 DIAGNOSIS — E119 Type 2 diabetes mellitus without complications: Secondary | ICD-10-CM | POA: Diagnosis present

## 2017-01-06 DIAGNOSIS — R0600 Dyspnea, unspecified: Secondary | ICD-10-CM

## 2017-01-06 DIAGNOSIS — J9621 Acute and chronic respiratory failure with hypoxia: Secondary | ICD-10-CM | POA: Diagnosis present

## 2017-01-06 DIAGNOSIS — G4733 Obstructive sleep apnea (adult) (pediatric): Secondary | ICD-10-CM | POA: Diagnosis present

## 2017-01-06 DIAGNOSIS — I5033 Acute on chronic diastolic (congestive) heart failure: Secondary | ICD-10-CM | POA: Diagnosis present

## 2017-01-06 LAB — CBC WITH DIFFERENTIAL/PLATELET
BASOS ABS: 0 10*3/uL (ref 0.0–0.1)
BASOS PCT: 0 %
EOS ABS: 0.1 10*3/uL (ref 0.0–0.7)
Eosinophils Relative: 2 %
HCT: 37 % — ABNORMAL LOW (ref 39.0–52.0)
HEMOGLOBIN: 11.9 g/dL — AB (ref 13.0–17.0)
Lymphocytes Relative: 20 %
Lymphs Abs: 1.9 10*3/uL (ref 0.7–4.0)
MCH: 28.4 pg (ref 26.0–34.0)
MCHC: 32.2 g/dL (ref 30.0–36.0)
MCV: 88.3 fL (ref 78.0–100.0)
MONOS PCT: 6 %
Monocytes Absolute: 0.6 10*3/uL (ref 0.1–1.0)
NEUTROS PCT: 72 %
Neutro Abs: 6.6 10*3/uL (ref 1.7–7.7)
Platelets: 234 10*3/uL (ref 150–400)
RBC: 4.19 MIL/uL — ABNORMAL LOW (ref 4.22–5.81)
RDW: 15.3 % (ref 11.5–15.5)
WBC: 9.2 10*3/uL (ref 4.0–10.5)

## 2017-01-06 LAB — COMPREHENSIVE METABOLIC PANEL
ALBUMIN: 2.8 g/dL — AB (ref 3.5–5.0)
ALK PHOS: 79 U/L (ref 38–126)
ALT: 21 U/L (ref 17–63)
ANION GAP: 6 (ref 5–15)
AST: 18 U/L (ref 15–41)
BUN: 58 mg/dL — ABNORMAL HIGH (ref 6–20)
CALCIUM: 10.3 mg/dL (ref 8.9–10.3)
CO2: 30 mmol/L (ref 22–32)
Chloride: 106 mmol/L (ref 101–111)
Creatinine, Ser: 1.71 mg/dL — ABNORMAL HIGH (ref 0.61–1.24)
GFR calc Af Amer: 47 mL/min — ABNORMAL LOW (ref >60)
GFR calc non Af Amer: 41 mL/min — ABNORMAL LOW (ref >60)
GLUCOSE: 223 mg/dL — AB (ref 65–99)
POTASSIUM: 4.3 mmol/L (ref 3.5–5.1)
SODIUM: 142 mmol/L (ref 135–145)
Total Bilirubin: 0.6 mg/dL (ref 0.3–1.2)
Total Protein: 7 g/dL (ref 6.5–8.1)

## 2017-01-06 LAB — BRAIN NATRIURETIC PEPTIDE: B Natriuretic Peptide: 37.5 pg/mL (ref 0.0–100.0)

## 2017-01-06 LAB — I-STAT ARTERIAL BLOOD GAS, ED
Acid-Base Excess: 7 mmol/L — ABNORMAL HIGH (ref 0.0–2.0)
Bicarbonate: 33.1 mmol/L — ABNORMAL HIGH (ref 20.0–28.0)
O2 Saturation: 97 %
TCO2: 35 mmol/L — AB (ref 22–32)
pCO2 arterial: 49.7 mmHg — ABNORMAL HIGH (ref 32.0–48.0)
pH, Arterial: 7.431 (ref 7.350–7.450)
pO2, Arterial: 88 mmHg (ref 83.0–108.0)

## 2017-01-06 LAB — I-STAT TROPONIN, ED: TROPONIN I, POC: 0.01 ng/mL (ref 0.00–0.08)

## 2017-01-06 MED ORDER — MORPHINE SULFATE (PF) 4 MG/ML IV SOLN
4.0000 mg | Freq: Once | INTRAVENOUS | Status: AC
  Administered 2017-01-07: 4 mg via INTRAVENOUS
  Filled 2017-01-06 (×3): qty 1

## 2017-01-06 MED ORDER — FUROSEMIDE 10 MG/ML IJ SOLN
80.0000 mg | Freq: Once | INTRAMUSCULAR | Status: AC
  Administered 2017-01-06: 80 mg via INTRAVENOUS
  Filled 2017-01-06: qty 8

## 2017-01-06 MED ORDER — ONDANSETRON HCL 4 MG/2ML IJ SOLN
4.0000 mg | Freq: Once | INTRAMUSCULAR | Status: DC
  Filled 2017-01-06 (×2): qty 2

## 2017-01-06 NOTE — ED Provider Notes (Signed)
  Face-to-face evaluation   History: Patient here for evaluation of swelling, and sleepiness.  He has been at a skilled nursing facility, for 2 days since discharge from long-term acute care hospitalization.  He is unable to give history.  His wife is here with him and is very concerned that he is not getting appropriate care including medications and inappropriate CPAP mask.  Physical exam: Lethargic.  Lungs clear anteriorly.  Generalized edema.   Clinical Course as of Jan 07 2247  Thu Jan 06, 2017  2246 Normal Troponin i, poc: 0.01 [EW]  2246 normal WBC: 9.2 [EW]  2246 normal Potassium: 4.3 [EW]  2247 high Glucose: (!) 223 [EW]  2247 high BUN: (!) 58 [EW]  2247 high Creatinine: (!) 1.71 [EW]  2247 Mild edema DG Chest 2 View [EW]    Clinical Course User Index [EW] Mancel BaleWentz, Brandyn Lowrey, MD    EKG Interpretation  Date/Time:  Thursday January 06 2017 20:56:07 EST Ventricular Rate:  88 PR Interval:    QRS Duration: 99 QT Interval:  367 QTC Calculation: 444 R Axis:   24 Text Interpretation:  Sinus rhythm Consider anterior infarct since last tracing no significant change Confirmed by Mancel BaleWentz, Bev Drennen [phone]([id number]) on 01/06/2017 10:35:11 PM       Patient Vitals for the past 24 hrs:  BP Pulse Resp SpO2  01/06/17 2230 123/69 81 14 96 %  01/06/17 2215 116/64 81 - 96 %  01/06/17 2100 (!) 144/77 88 - 96 %  01/06/17 2045 (!) 149/75 87 - 97 %  01/06/17 2030 (!) 147/77 88 13 98 %  01/06/17 2015 (!) 161/96 85 14 99 %  01/06/17 2000 (!) 174/91 89 - 97 %  01/06/17 1945 (!) 156/88 89 17 97 %  01/06/17 1932 - - - 93 %  01/06/17 1931 (!) 142/78 85 - (!) 83 %  01/06/17 1930 (!) 142/78 87 18 -     Medical screening examination/treatment/procedure(s) were conducted as a shared visit with non-physician practitioner(s) and myself.  I personally evaluated the patient during the encounter   Mancel BaleWentz, Chad Donoghue, MD 01/07/17 [phone]

## 2017-01-06 NOTE — ED Notes (Addendum)
PT is asleep. Family refused meds since he seems to be resting ok.  This nurse agreed.

## 2017-01-06 NOTE — ED Notes (Signed)
Respiratory at bedside.

## 2017-01-06 NOTE — Progress Notes (Signed)
Location:   [facility] Room Number: 126 B Place of Service:  SNF (31)   CODE STATUS: full code   No Known Allergies  Chief Complaint  Patient presents with  . Hospitalization Follow-up    Hospital follow up    HPI:  He has had a prolonged hospitalization due to a traumatic quadriplegia spinal cord injury T1-T6. He has chronic diastolic heart failure. He is not aware of surroundings is unable to participate in the hpi or ros. He has significant generalized edema present. He is very high risk for rehospitalization.  He will continue therapy as directed. He will be followed for his chronic illnesses including: diastolic heart failure; diabetes; respiratory failure.   Past Medical History:  Diagnosis Date  . Anasarca   . CHF (congestive heart failure) (HCC)   . Diabetes mellitus without complication (HCC)   . Hyperlipidemia   . Hypertension   . Malnutrition (HCC)   . Quadriplegia (HCC)   . Respiratory failure [facility])     Past Surgical History:  Procedure Laterality Date  . IR GASTROSTOMY TUBE MOD SED  12/20/2016    Social History   Socioeconomic History  . Marital status: Single    Spouse name: Not on file  . Number of children: Not on file  . Years of education: Not on file  . Highest education level: Not on file  Social Needs  . Financial resource strain: Not on file  . Food insecurity - worry: Not on file  . Food insecurity - inability: Not on file  . Transportation needs - medical: Not on file  . Transportation needs - non-medical: Not on file  Occupational History  . Not on file  Tobacco Use  . Smoking status: Never Smoker  . Smokeless tobacco: Never Used  Substance and Sexual Activity  . Alcohol use: Not on file  . Drug use: Not on file  . Sexual activity: Not on file  Other Topics Concern  . Not on file  Social History Narrative  . Not on file   History reviewed. No pertinent family history.    VITAL SIGNS BP (!) 160/72   Pulse 80    Temp 98.9 F (37.2 C)   Resp 18   Ht 5\' 10"  (1.778 m)   Wt (!) 305 lb 6.4 oz (138.5 kg)   SpO2 94%   BMI 43.82 kg/m   Outpatient Encounter Medications as of 01/06/2017  Medication Sig  . amLODipine (NORVASC) 10 MG tablet Place 10 mg daily into feeding tube.  Marland Kitchen aspirin EC 81 MG tablet Give 1 tablet daily via G-Tube  . atorvastatin (LIPITOR) 20 MG tablet Place 20 mg every evening into feeding tube.  . calcium carbonate 1250 MG capsule Place 1,250 mg daily into feeding tube.  . carvedilol (COREG) 12.5 MG tablet Place 12.5 mg 2 (two) times daily with a meal into feeding tube.  . cholecalciferol (VITAMIN D) 1000 units tablet Give 1 tablet via G-tube daily  . cyanocobalamin 1000 MCG tablet Place 1,000 mcg daily into feeding tube.  . famotidine (PEPCID) 20 MG tablet Place 20 mg 2 (two) times daily into feeding tube.  . furosemide (LASIX) 40 MG tablet Place 40 mg daily into feeding tube.  . gabapentin (NEURONTIN) 400 MG capsule Place 400 mg 3 (three) times daily into feeding tube.  . heparin 5000 UNIT/ML injection Inject 5,000 Units every 8 (eight) hours into the skin.  . hydrALAZINE (APRESOLINE) 10 MG tablet Place 20 mg 3 (three)  times daily into feeding tube.  . insulin glargine (LANTUS) 100 UNIT/ML injection Inject 28 Units at bedtime into the skin.  Marland Kitchen. insulin lispro (HUMALOG) 100 UNIT/ML injection Inject as per sliding scale subcutaneously every 6 hours 200 - 250 = 2 units 251 - 300 = 4 units 301 - 400 = 6 units Greater than 400 call MD  . Multiple Vitamin (MULTIVITAMIN) tablet Place 1 tablet daily into feeding tube.  . Nutritional Supplements (NUTRITIONAL DRINK) LIQD Administer Sugar Free Med pass per GT via Pump.  Rate 7550ml/hr for 24 hours a day  . polyethylene glycol (MIRALAX / GLYCOLAX) packet Place 17 g daily into feeding tube.  . risperiDONE (RISPERDAL) 1 MG tablet Place 1 mg 2 (two) times daily into feeding tube.  . senna (SENOKOT) 8.6 MG tablet Place 2 tablets daily into  feeding tube.  Marland Kitchen. UNABLE TO FIND Enternal feed order every 12 hours Bolus with 50 ml of water every 2 hours for hydration and tube patency.  Flush every shift with 30-60 ml water before and after meds, before initiating feedings or when there is an interruption of feeding to maintain tube patency.  Flush every shift with 5-2010ml water between each medication   No facility-administered encounter medications on file as of 01/06/2017.      SIGNIFICANT DIAGNOSTIC EXAMS  None recent  LABS REVIEWED: TODAY:   11-15-16: glucose 139; bun 27; creat 1.75; k+ 4.3; na++131; ca 8.4; liver normal albumin 3.1 hgb a1c 6.8  11-27-16: wbc 8.1; hgb 11.4; hct 33.7; mcv 81.4; plt 288 12-04-16: mag 2.5 12-14-16: glucose 143; bun 41; creat 1.22; k+ 4.5; na++ 134; ca 9.8  Review of Systems  Unable to perform ROS: Other (unable to answer questions )    Physical Exam  Constitutional: No distress.  Morbidly obese   Neck: Neck supple. No thyromegaly present.  Cardiovascular: Normal rate, regular rhythm, normal heart sounds and intact distal pulses.  Pulmonary/Chest: Effort normal. No respiratory distress.  Breath sounds diminished   Abdominal: Soft. Bowel sounds are normal. He exhibits no distension. There is no tenderness.  Peg tube present   Genitourinary:  Genitourinary Comments: Has foley   Musculoskeletal: He exhibits edema.  Significant generalized edema present anasarca  Is able to move toes bilaterally Able to move left thumb Nominal movement of right fingers  Lymphadenopathy:    He has no cervical adenopathy.  Neurological:  Is aware   Skin: Skin is warm and dry. He is not diaphoretic.  Lower legs discolored      ASSESSMENT/ PLAN:  TODAY:   1. Hypertension: stable b/p 190/72 will conoitnue norvasc 10 mg daily; coreg 12.5 mg twice daily apresoline 20 mg three times daily  and will monitor  2. Chronic diastolic heart failure: without change; has anasarca present; will continue coreg 12.5  mg twice daily apresoline 20 mg three times daily  asa 81 mg daily lasix 40 mg daily   3. Dyslipidemia: stable will continue lipitor 20 mg daily   4. Diabetes: hgb a1c 6.8; stable will continue lantus 28 units nightly humalog SSI: every 6 hours: 200-250= 2u; 251-300 =4u; 351-400 6 units will monitor   5. Diabetic peripheral neuropathy: stable will continue nuerontin 400 mg three times dail y  6. Gerd: stable will continue pepcid 20 mg twice daily   7. Constipation: stable will continue miralax daily and senna 2 tabs daily   8. Dysphagia: no signs of aspiration present; is tube feeding dependent for his nutritional needs  9. 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 Anasarca: without change will continue to build upon his nutritional status; will continue lasix 40 mg daily   Will check cbc; cmp; mag level     MD is aware of resident's narcotic use and is in agreement with current plan of care. We will attempt to wean resident as apropriate   Synthia Innocenteborah Green NP [facility]  Contact [phone] Monday through Friday 8am- 5pm  After hours call [phone]

## 2017-01-06 NOTE — ED Notes (Signed)
Two unsuccessful IV attempts.

## 2017-01-06 NOTE — ED Notes (Signed)
Patient given swabs to wet mouth.

## 2017-01-06 NOTE — ED Triage Notes (Signed)
Per EMS, patient is coming from [facility].  He was discharged from here yesterday and transported to [facility].  Pt has feeding tube and foley.  IV noted on admission. It states that it has been in since 12/26/16.  Hx of UTIs, respiratory failure, CKD, diabetes, and CHF.  Spouse on way.  She states that she noticed a large difference in fluid retention since yesterday.  Patient is A/O x 4 and very lethargic.  NSR 12 lead unremarkable.  170/100, HR 82, 94% on 2L, CBG 261.

## 2017-01-06 NOTE — ED Notes (Signed)
Respiratory paged for ABG.  

## 2017-01-06 NOTE — ED Notes (Signed)
Admitting MD at bedside.

## 2017-01-06 NOTE — ED Provider Notes (Signed)
[facility] EMERGENCY [facility] Provider Note   CSN: [id number] Arrival date & time: 01/06/17  1926     History   Chief Complaint Chief Complaint  Patient presents with  . Fluid retention    HPI Jordan Villanueva is a 61 y.o. male.  Patient with history of quadriparesis, G-tube placement, congestive heart failure on Lasix, non-oxygen dependent at home but uses CPAP at night -presents from [facility] nursing facility where he was admitted yesterday after staying at [facility].  Per nursing home reports, patient was confused today and "groggy".  He was evaluated by RT there.  He was noted to have low oxygen saturation on supplemental oxygen.  It is unclear if he uses chronically.  Patient was offered BiPAP, but refused.  This prompted emergency department visit.  They noted patient to have worsening swelling to his bilateral upper extremities which he does not move at baseline.  No reported fevers, cough, chest pain.  No abdominal pain, nausea, vomiting. Patient complains of pain in his left leg. The onset of this condition was acute. The course is constant. Aggravating factors: none. Alleviating factors: none.        History reviewed. No pertinent past medical history.  There are no active problems to display for this patient.   Past Surgical History:  Procedure Laterality Date  . IR GASTROSTOMY TUBE MOD SED  12/20/2016       Home Medications    Prior to Admission medications   Medication Sig Start Date End Date Taking? Authorizing Provider  amLODipine (NORVASC) 10 MG tablet Place 10 mg daily into feeding tube.   Yes [provider]  aspirin EC 81 MG tablet Give 1 tablet daily via G-Tube   Yes [provider]  atorvastatin (LIPITOR) 20 MG tablet Place 20 mg every evening into feeding tube.   Yes [provider]  calcium carbonate 1250 MG capsule Place 1,250 mg daily into feeding tube.   Yes [provider]  carvedilol (COREG)  12.5 MG tablet Place 12.5 mg 2 (two) times daily with a meal into feeding tube.   Yes [provider]  cholecalciferol (VITAMIN D) 1000 units tablet Give 1 tablet via G-tube daily   Yes [provider]  cyanocobalamin 1000 MCG tablet Place 1,000 mcg daily into feeding tube.   Yes [provider]  famotidine (PEPCID) 20 MG tablet Place 20 mg 2 (two) times daily into feeding tube.   Yes [provider]  furosemide (LASIX) 40 MG tablet Place 40 mg daily into feeding tube.   Yes [provider]  gabapentin (NEURONTIN) 400 MG capsule Place 400 mg 3 (three) times daily into feeding tube.   Yes [provider]  heparin 5000 UNIT/ML injection Inject 5,000 Units every 8 (eight) hours into the skin.   Yes [provider]  hydrALAZINE (APRESOLINE) 10 MG tablet Place 20 mg 3 (three) times daily into feeding tube.   Yes [provider]  insulin glargine (LANTUS) 100 UNIT/ML injection Inject 28 Units at bedtime into the skin.   Yes [provider]  insulin lispro (HUMALOG) 100 UNIT/ML injection Inject as per sliding scale subcutaneously every 6 hours 200 - 250 = 2 units 251 - 300 = 4 units 301 - 400 = 6 units Greater than 400 call MD   Yes [provider]  Multiple Vitamin (MULTIVITAMIN) tablet Place 1 tablet daily into feeding tube.   Yes [provider]  Nutritional Supplements (NUTRITIONAL DRINK) LIQD Administer Sugar  Free Med pass per GT via Pump.  Rate 49ml/hr for 24 hours a day   Yes [provider]  oxyCODONE (ROXICODONE) 5 MG immediate release tablet Place 5 mg every 6 (six) hours as needed into feeding tube for severe pain.   Yes [provider]  polyethylene glycol (MIRALAX / GLYCOLAX) packet Place 17 g daily into feeding tube.   Yes [provider]  risperiDONE (RISPERDAL) 1 MG tablet Place 1 mg 2 (two) times daily into feeding tube.   Yes [provider]  senna  (SENOKOT) 8.6 MG tablet Place 2 tablets daily into feeding tube.   Yes [provider]  UNABLE TO FIND Enternal feed order every 12 hours Bolus with 50 ml of water every 2 hours for hydration and tube patency.  Flush every shift with 30-60 ml water before and after meds, before initiating feedings or when there is an interruption of feeding to maintain tube patency.  Flush every shift with 5-27ml water between each medication   Yes [provider]    Family History History reviewed. No pertinent family history.  Social History Social History   Tobacco Use  . Smoking status: Never Smoker  . Smokeless tobacco: Never Used  Substance Use Topics  . Alcohol use: Not on file  . Drug use: Not on file     Allergies   Patient has no known allergies.   Review of Systems Review of Systems  Constitutional: Negative for diaphoresis and fever.  Eyes: Negative for redness.  Respiratory: Positive for shortness of breath. Negative for cough.   Cardiovascular: Positive for chest pain. Negative for palpitations and leg swelling.  Gastrointestinal: Negative for abdominal pain, nausea and vomiting.  Genitourinary: Negative for dysuria.  Musculoskeletal: Positive for back pain (chronic) and myalgias. Negative for neck pain.  Skin: Negative for rash.  Neurological: Negative for syncope and light-headedness.  Psychiatric/Behavioral: Positive for confusion. The patient is not nervous/anxious.      Physical Exam Updated Vital Signs BP (!) 142/78 (BP Location: Left Wrist)   Pulse 85   SpO2 93%   Physical Exam  Constitutional: He appears well-developed and well-nourished.  HENT:  Head: Normocephalic and atraumatic.  Mouth/Throat: Mucous membranes are normal. Mucous membranes are not dry.  Dry mucous membranes  Eyes: Conjunctivae are normal.  Neck: Trachea normal and normal range of motion. Neck supple. Normal carotid pulses and no JVD present. No muscular tenderness  present. Carotid bruit is not present. No tracheal deviation present.  Cardiovascular: Normal rate, regular rhythm, S1 normal, S2 normal, normal heart sounds and intact distal pulses. Exam reveals no distant heart sounds and no decreased pulses.  No murmur heard. Pulmonary/Chest: Effort normal and breath sounds normal. No respiratory distress. He has no wheezes. He exhibits no tenderness.  Generally diminished breath sounds.  Abdominal: Soft. Normal aorta and bowel sounds are normal. There is no tenderness. There is no rebound and no guarding.  G-tube in place  Musculoskeletal: He exhibits edema.  Lower extremities: 1-2+ edema symmetric  Upper extremities: 3-4+ edema symmetric  Neurological: He is alert.  Skin: Skin is warm and dry. He is not diaphoretic. No cyanosis. No pallor.  Psychiatric: He has a normal mood and affect.  Nursing note and vitals reviewed.    ED Treatments / Results  Labs (all labs ordered are listed, but only abnormal results are displayed) Labs Reviewed  CBC WITH DIFFERENTIAL/PLATELET - Abnormal; Notable for the following components:      Result Value  RBC 4.19 (*)    Hemoglobin 11.9 (*)    HCT 37.0 (*)    All other components within normal limits  COMPREHENSIVE METABOLIC PANEL - Abnormal; Notable for the following components:   Glucose, Bld 223 (*)    BUN 58 (*)    Creatinine, Ser 1.71 (*)    Albumin 2.8 (*)    GFR calc non Af Amer 41 (*)    GFR calc Af Amer 47 (*)    All other components within normal limits  I-STAT ARTERIAL BLOOD GAS, ED - Abnormal; Notable for the following components:   pCO2 arterial 49.7 (*)    Bicarbonate 33.1 (*)    TCO2 35 (*)    Acid-Base Excess 7.0 (*)    All other components within normal limits  BRAIN NATRIURETIC PEPTIDE  I-STAT TROPONIN, ED  I-STAT ARTERIAL BLOOD GAS, ED    EKG  EKG Interpretation  Date/Time:  Thursday January 06 2017 20:56:07 EST Ventricular Rate:  88 PR Interval:    QRS Duration: 99 QT  Interval:  367 QTC Calculation: 444 R Axis:   24 Text Interpretation:  Sinus rhythm Consider anterior infarct since last tracing no significant change Confirmed by Mancel BaleWentz, Elliott [phone]([id number]) on 01/06/2017 10:35:11 PM       Radiology Dg Chest 2 View  Result Date: 01/06/2017 CLINICAL DATA:  Shortness of breath. EXAM: CHEST  2 VIEW COMPARISON:  Chest radiograph 12/14/2016. FINDINGS: Patient is rotated to the left. Anterior cervical spinal fusion hardware. Marked cardiomegaly. Monitoring leads overlie the patient. Pulmonary vascular redistribution. Bilateral hazy pulmonary opacities. Small left pleural effusion. IMPRESSION: Findings suggestive of mild interstitial edema and possible left pleural effusion. Cardiomegaly. Limited exam. Electronically Signed   By: Annia Beltrew  Davis M.D.   On: 01/06/2017 21:55    Procedures Procedures (including critical care time)  Medications Ordered in ED Medications  morphine 4 MG/ML injection 4 mg (4 mg Intravenous Not Given 01/06/17 2208)  ondansetron [facility](ZOFRAN) injection 4 mg (4 mg Intravenous Not Given 01/06/17 2208)  furosemide (LASIX) injection 80 mg (80 mg Intravenous Given 01/06/17 2305)     Initial Impression / Assessment and Plan / ED Course  I have reviewed the triage vital signs and the nursing notes.  Pertinent labs & imaging results that were available during my care of the patient were reviewed by me and considered in my medical decision making (see chart for details).  Clinical Course as of Jan 08 3  Thu Jan 06, 2017  2246 Normal Troponin i, poc: 0.01 [EW]  2246 normal WBC: 9.2 [EW]  2246 normal Potassium: 4.3 [EW]  2247 high Glucose: (!) 223 [EW]  2247 high BUN: (!) 58 [EW]  2247 high Creatinine: (!) 1.71 [EW]  2247 Mild edema DG Chest 2 View [EW]    Clinical Course User Index [EW] Mancel BaleWentz, Elliott, MD    Patient seen and examined. Work-up initiated. Medications ordered. Reviewed nursing facility records.   Vital signs reviewed and are as  follows: BP 136/73   Pulse 88   Resp 14   SpO2 95%   Pt discussed with and seen by Dr. Effie ShyWentz. ABG ordered and is reassuring.   Will request hospitalization for diuresis. Pt with significant upper extremity edema with hypoxia today requiring O2 supplementation.   12:10 AM Spoke with Dr. Katrinka BlazingSmith who will see patient.   Final Clinical Impressions(s) / ED Diagnoses   Final diagnoses:  Shortness of breath  Hypervolemia, unspecified hypervolemia type   Admit.    ED Discharge  Orders    None       Renne Crigler, PA-C 01/07/17 0011    Mancel Bale, MD 01/07/17 [phone]

## 2017-01-06 NOTE — Progress Notes (Signed)
  Location:   [facility] Room Number: 126 B Place of Service:  SNF (31)   CODE STATUS: Full Code  No Known Allergies  Chief Complaint  Patient presents with  . Hospitalization Follow-up    Hospital follow up    HPI:    History reviewed. No pertinent past medical history.  Past Surgical History:  Procedure Laterality Date  . IR GASTROSTOMY TUBE MOD SED  12/20/2016    Social History   Socioeconomic History  . Marital status: Single    Spouse name: Not on file  . Number of children: Not on file  . Years of education: Not on file  . Highest education level: Not on file  Social Needs  . Financial resource strain: Not on file  . Food insecurity - worry: Not on file  . Food insecurity - inability: Not on file  . Transportation needs - medical: Not on file  . Transportation needs - non-medical: Not on file  Occupational History  . Not on file  Tobacco Use  . Smoking status: Never Smoker  . Smokeless tobacco: Never Used  Substance and Sexual Activity  . Alcohol use: Not on file  . Drug use: Not on file  . Sexual activity: Not on file  Other Topics Concern  . Not on file  Social History Narrative  . Not on file   History reviewed. No pertinent family history.    VITAL SIGNS BP (!) 160/72   Pulse 80   Temp 98.9 F (37.2 C)   Resp 18   Ht 5\' 10"  (1.778 m)   Wt (!) 305 lb 6.4 oz (138.5 kg)   SpO2 94%   BMI 43.82 kg/m     Medication List        Accurate as of 01/06/17 11:57 AM. Always use your most recent med list.          amLODipine 10 MG tablet Commonly known as:  NORVASC   aspirin EC 81 MG tablet   atorvastatin 20 MG tablet Commonly known as:  LIPITOR   calcium carbonate 1250 MG capsule   carvedilol 12.5 MG tablet Commonly known as:  COREG   cholecalciferol 1000 units tablet Commonly known as:  VITAMIN D   cyanocobalamin 1000 MCG tablet   famotidine 20 MG tablet Commonly known as:  PEPCID   furosemide 40 MG  tablet Commonly known as:  LASIX   gabapentin 400 MG capsule Commonly known as:  NEURONTIN   heparin 5000 UNIT/ML injection   HUMALOG 100 UNIT/ML injection Generic drug:  insulin lispro   hydrALAZINE 10 MG tablet Commonly known as:  APRESOLINE   LANTUS 100 UNIT/ML injection Generic drug:  insulin glargine   multivitamin tablet   NUTRITIONAL DRINK Liqd   polyethylene glycol packet Commonly known as:  MIRALAX / GLYCOLAX   risperiDONE 1 MG tablet Commonly known as:  RISPERDAL   ROXICODONE 5 MG immediate release tablet Generic drug:  oxyCODONE   senna 8.6 MG tablet Commonly known as:  SENOKOT   UNABLE TO FIND        SIGNIFICANT DIAGNOSTIC EXAMS       ASSESSMENT/ PLAN:    MD is aware of resident's narcotic use and is in agreement with current plan of care. We will attempt to wean resident as apropriate   Synthia Innocenteborah Green NP [facility]  Contact [phone] Monday through Friday 8am- 5pm  After hours call [phone]

## 2017-01-07 ENCOUNTER — Other Ambulatory Visit (HOSPITAL_COMMUNITY): Payer: Medicaid Other

## 2017-01-07 ENCOUNTER — Inpatient Hospital Stay (HOSPITAL_COMMUNITY): Payer: Medicare Other

## 2017-01-07 DIAGNOSIS — E778 Other disorders of glycoprotein metabolism: Secondary | ICD-10-CM | POA: Diagnosis present

## 2017-01-07 DIAGNOSIS — S24101A Unspecified injury at T1 level of thoracic spinal cord, initial encounter: Secondary | ICD-10-CM | POA: Diagnosis present

## 2017-01-07 DIAGNOSIS — J9690 Respiratory failure, unspecified, unspecified whether with hypoxia or hypercapnia: Secondary | ICD-10-CM | POA: Diagnosis present

## 2017-01-07 DIAGNOSIS — G4733 Obstructive sleep apnea (adult) (pediatric): Secondary | ICD-10-CM | POA: Diagnosis present

## 2017-01-07 DIAGNOSIS — E46 Unspecified protein-calorie malnutrition: Secondary | ICD-10-CM

## 2017-01-07 DIAGNOSIS — N183 Chronic kidney disease, stage 3 (moderate): Secondary | ICD-10-CM | POA: Diagnosis present

## 2017-01-07 DIAGNOSIS — F0391 Unspecified dementia with behavioral disturbance: Secondary | ICD-10-CM

## 2017-01-07 DIAGNOSIS — S24101D Unspecified injury at T1 level of thoracic spinal cord, subsequent encounter: Secondary | ICD-10-CM

## 2017-01-07 DIAGNOSIS — J9621 Acute and chronic respiratory failure with hypoxia: Secondary | ICD-10-CM | POA: Diagnosis present

## 2017-01-07 DIAGNOSIS — E1169 Type 2 diabetes mellitus with other specified complication: Secondary | ICD-10-CM | POA: Diagnosis present

## 2017-01-07 DIAGNOSIS — Z931 Gastrostomy status: Secondary | ICD-10-CM | POA: Diagnosis not present

## 2017-01-07 DIAGNOSIS — E785 Hyperlipidemia, unspecified: Secondary | ICD-10-CM | POA: Diagnosis present

## 2017-01-07 DIAGNOSIS — R4702 Dysphasia: Secondary | ICD-10-CM | POA: Diagnosis present

## 2017-01-07 DIAGNOSIS — F329 Major depressive disorder, single episode, unspecified: Secondary | ICD-10-CM | POA: Diagnosis present

## 2017-01-07 DIAGNOSIS — R0602 Shortness of breath: Secondary | ICD-10-CM | POA: Diagnosis present

## 2017-01-07 DIAGNOSIS — I509 Heart failure, unspecified: Secondary | ICD-10-CM | POA: Diagnosis not present

## 2017-01-07 DIAGNOSIS — E669 Obesity, unspecified: Secondary | ICD-10-CM

## 2017-01-07 DIAGNOSIS — R601 Generalized edema: Secondary | ICD-10-CM | POA: Diagnosis present

## 2017-01-07 DIAGNOSIS — Z794 Long term (current) use of insulin: Secondary | ICD-10-CM | POA: Diagnosis not present

## 2017-01-07 DIAGNOSIS — G9341 Metabolic encephalopathy: Secondary | ICD-10-CM | POA: Diagnosis present

## 2017-01-07 DIAGNOSIS — Z7982 Long term (current) use of aspirin: Secondary | ICD-10-CM | POA: Diagnosis not present

## 2017-01-07 DIAGNOSIS — G8250 Quadriplegia, unspecified: Secondary | ICD-10-CM | POA: Diagnosis present

## 2017-01-07 DIAGNOSIS — I5033 Acute on chronic diastolic (congestive) heart failure: Secondary | ICD-10-CM | POA: Diagnosis present

## 2017-01-07 DIAGNOSIS — I5030 Unspecified diastolic (congestive) heart failure: Secondary | ICD-10-CM | POA: Diagnosis not present

## 2017-01-07 DIAGNOSIS — Z79899 Other long term (current) drug therapy: Secondary | ICD-10-CM | POA: Diagnosis not present

## 2017-01-07 DIAGNOSIS — E8770 Fluid overload, unspecified: Secondary | ICD-10-CM | POA: Diagnosis not present

## 2017-01-07 DIAGNOSIS — I130 Hypertensive heart and chronic kidney disease with heart failure and stage 1 through stage 4 chronic kidney disease, or unspecified chronic kidney disease: Secondary | ICD-10-CM | POA: Diagnosis present

## 2017-01-07 DIAGNOSIS — S24101S Unspecified injury at T1 level of thoracic spinal cord, sequela: Secondary | ICD-10-CM | POA: Diagnosis not present

## 2017-01-07 DIAGNOSIS — Z981 Arthrodesis status: Secondary | ICD-10-CM | POA: Diagnosis not present

## 2017-01-07 DIAGNOSIS — E1122 Type 2 diabetes mellitus with diabetic chronic kidney disease: Secondary | ICD-10-CM | POA: Diagnosis present

## 2017-01-07 DIAGNOSIS — H548 Legal blindness, as defined in USA: Secondary | ICD-10-CM | POA: Diagnosis present

## 2017-01-07 DIAGNOSIS — N179 Acute kidney failure, unspecified: Secondary | ICD-10-CM | POA: Diagnosis present

## 2017-01-07 DIAGNOSIS — F0390 Unspecified dementia without behavioral disturbance: Secondary | ICD-10-CM | POA: Diagnosis present

## 2017-01-07 LAB — CBC WITH DIFFERENTIAL/PLATELET
BASOS ABS: 0 10*3/uL (ref 0.0–0.1)
Basophils Relative: 0 %
EOS PCT: 2 %
Eosinophils Absolute: 0.2 10*3/uL (ref 0.0–0.7)
HEMATOCRIT: 37.5 % — AB (ref 39.0–52.0)
Hemoglobin: 11.9 g/dL — ABNORMAL LOW (ref 13.0–17.0)
LYMPHS PCT: 24 %
Lymphs Abs: 2.2 10*3/uL (ref 0.7–4.0)
MCH: 28.1 pg (ref 26.0–34.0)
MCHC: 31.7 g/dL (ref 30.0–36.0)
MCV: 88.7 fL (ref 78.0–100.0)
MONO ABS: 0.8 10*3/uL (ref 0.1–1.0)
MONOS PCT: 9 %
NEUTROS ABS: 6.1 10*3/uL (ref 1.7–7.7)
Neutrophils Relative %: 65 %
PLATELETS: 218 10*3/uL (ref 150–400)
RBC: 4.23 MIL/uL (ref 4.22–5.81)
RDW: 15 % (ref 11.5–15.5)
WBC: 9.3 10*3/uL (ref 4.0–10.5)

## 2017-01-07 LAB — URINALYSIS, ROUTINE W REFLEX MICROSCOPIC
Bilirubin Urine: NEGATIVE
Glucose, UA: 50 mg/dL — AB
Hgb urine dipstick: NEGATIVE
Ketones, ur: NEGATIVE mg/dL
LEUKOCYTES UA: NEGATIVE
Nitrite: NEGATIVE
PROTEIN: 100 mg/dL — AB
SPECIFIC GRAVITY, URINE: 1.016 (ref 1.005–1.030)
pH: 5 (ref 5.0–8.0)

## 2017-01-07 LAB — GLUCOSE, CAPILLARY
GLUCOSE-CAPILLARY: 133 mg/dL — AB (ref 65–99)
Glucose-Capillary: 173 mg/dL — ABNORMAL HIGH (ref 65–99)

## 2017-01-07 LAB — MRSA PCR SCREENING: MRSA by PCR: NEGATIVE

## 2017-01-07 LAB — TSH: TSH: 1.784 u[IU]/mL (ref 0.350–4.500)

## 2017-01-07 LAB — CBG MONITORING, ED
GLUCOSE-CAPILLARY: 182 mg/dL — AB (ref 65–99)
GLUCOSE-CAPILLARY: 164 mg/dL — AB (ref 65–99)
Glucose-Capillary: 162 mg/dL — ABNORMAL HIGH (ref 65–99)

## 2017-01-07 LAB — PREALBUMIN: PREALBUMIN: 20.5 mg/dL (ref 18–38)

## 2017-01-07 MED ORDER — NUTRITIONAL DRINK PO LIQD: 50.0000 mL/h | ORAL | Status: DC

## 2017-01-07 MED ORDER — IPRATROPIUM-ALBUTEROL 0.5-2.5 (3) MG/3ML IN SOLN: 3.0000 mL | RESPIRATORY_TRACT | Status: DC | PRN

## 2017-01-07 MED ORDER — ACETAMINOPHEN 325 MG PO TABS: 650.0000 mg | ORAL_TABLET | ORAL | Status: DC | PRN

## 2017-01-07 MED ORDER — ASPIRIN EC 81 MG PO TBEC: 81.0000 mg | DELAYED_RELEASE_TABLET | Freq: Every day | ORAL | Status: DC

## 2017-01-07 MED ORDER — OXYCODONE HCL 5 MG PO TABS
5.0000 mg | ORAL_TABLET | Freq: Four times a day (QID) | ORAL | Status: DC | PRN
  Administered 2017-01-07 – 2017-01-12 (×9): 5 mg
  Filled 2017-01-07 (×10): qty 1

## 2017-01-07 MED ORDER — SODIUM CHLORIDE 0.9% FLUSH
3.0000 mL | Freq: Two times a day (BID) | INTRAVENOUS | Status: DC
  Administered 2017-01-07 – 2017-01-12 (×9): 3 mL via INTRAVENOUS

## 2017-01-07 MED ORDER — CARVEDILOL 12.5 MG PO TABS
12.5000 mg | ORAL_TABLET | Freq: Two times a day (BID) | ORAL | Status: DC
  Administered 2017-01-07 – 2017-01-12 (×10): 12.5 mg
  Filled 2017-01-07 (×10): qty 1

## 2017-01-07 MED ORDER — GABAPENTIN 400 MG PO CAPS
400.0000 mg | ORAL_CAPSULE | Freq: Three times a day (TID) | ORAL | Status: DC
  Administered 2017-01-07 – 2017-01-12 (×16): 400 mg
  Filled 2017-01-07 (×16): qty 1

## 2017-01-07 MED ORDER — INSULIN ASPART 100 UNIT/ML ~~LOC~~ SOLN
0.0000 [IU] | SUBCUTANEOUS | Status: DC
  Administered 2017-01-07 (×2): 2 [IU] via SUBCUTANEOUS
  Administered 2017-01-07: 1 [IU] via SUBCUTANEOUS
  Administered 2017-01-07: 2 [IU] via SUBCUTANEOUS
  Administered 2017-01-08 (×5): 1 [IU] via SUBCUTANEOUS
  Administered 2017-01-09 (×2): 2 [IU] via SUBCUTANEOUS
  Administered 2017-01-09 (×2): 1 [IU] via SUBCUTANEOUS
  Administered 2017-01-09 (×2): 2 [IU] via SUBCUTANEOUS
  Administered 2017-01-10: 3 [IU] via SUBCUTANEOUS
  Administered 2017-01-10 – 2017-01-11 (×7): 2 [IU] via SUBCUTANEOUS
  Administered 2017-01-11 (×2): 3 [IU] via SUBCUTANEOUS
  Administered 2017-01-11 (×2): 2 [IU] via SUBCUTANEOUS
  Administered 2017-01-12: 1 [IU] via SUBCUTANEOUS
  Administered 2017-01-12 (×2): 2 [IU] via SUBCUTANEOUS
  Filled 2017-01-07 (×2): qty 1

## 2017-01-07 MED ORDER — HYDRALAZINE HCL 10 MG PO TABS
20.0000 mg | ORAL_TABLET | Freq: Three times a day (TID) | ORAL | Status: DC
  Administered 2017-01-07 – 2017-01-12 (×16): 20 mg
  Filled 2017-01-07 (×17): qty 2

## 2017-01-07 MED ORDER — INSULIN GLARGINE 100 UNIT/ML ~~LOC~~ SOLN
28.0000 [IU] | Freq: Every day | SUBCUTANEOUS | Status: DC
  Administered 2017-01-07 – 2017-01-11 (×5): 28 [IU] via SUBCUTANEOUS
  Filled 2017-01-07 (×6): qty 0.28

## 2017-01-07 MED ORDER — ONDANSETRON HCL 4 MG/2ML IJ SOLN: 4.0000 mg | Freq: Four times a day (QID) | INTRAMUSCULAR | Status: DC | PRN

## 2017-01-07 MED ORDER — HEPARIN SODIUM (PORCINE) 5000 UNIT/ML IJ SOLN
5000.0000 [IU] | Freq: Three times a day (TID) | INTRAMUSCULAR | Status: DC
  Administered 2017-01-07 – 2017-01-12 (×16): 5000 [IU] via SUBCUTANEOUS
  Filled 2017-01-07 (×15): qty 1

## 2017-01-07 MED ORDER — ATORVASTATIN CALCIUM 20 MG PO TABS
20.0000 mg | ORAL_TABLET | Freq: Every evening | ORAL | Status: DC
  Administered 2017-01-07 – 2017-01-11 (×5): 20 mg
  Filled 2017-01-07 (×6): qty 1

## 2017-01-07 MED ORDER — SODIUM CHLORIDE 0.9 % IV SOLN: 250.0000 mL | INTRAVENOUS | Status: DC | PRN

## 2017-01-07 MED ORDER — FUROSEMIDE 10 MG/ML IJ SOLN
40.0000 mg | Freq: Two times a day (BID) | INTRAMUSCULAR | Status: DC
  Administered 2017-01-07 – 2017-01-08 (×2): 40 mg via INTRAVENOUS
  Filled 2017-01-07 (×2): qty 4

## 2017-01-07 MED ORDER — CALCIUM CARBONATE 1250 (500 CA) MG PO TABS
1250.0000 mg | ORAL_TABLET | Freq: Every day | ORAL | Status: DC
  Administered 2017-01-08 – 2017-01-12 (×5): 1250 mg
  Filled 2017-01-07 (×6): qty 1

## 2017-01-07 MED ORDER — SODIUM CHLORIDE 0.9% FLUSH
3.0000 mL | INTRAVENOUS | Status: DC | PRN
  Administered 2017-01-10: 3 mL via INTRAVENOUS
  Filled 2017-01-07: qty 3

## 2017-01-07 MED ORDER — AMLODIPINE BESYLATE 10 MG PO TABS
10.0000 mg | ORAL_TABLET | Freq: Every day | ORAL | Status: DC
  Administered 2017-01-07 – 2017-01-12 (×6): 10 mg
  Filled 2017-01-07 (×3): qty 1
  Filled 2017-01-07: qty 2
  Filled 2017-01-07 (×2): qty 1

## 2017-01-07 MED ORDER — ASPIRIN 81 MG PO CHEW
81.0000 mg | CHEWABLE_TABLET | Freq: Every day | ORAL | Status: DC
  Administered 2017-01-07 – 2017-01-12 (×6): 81 mg via ORAL
  Filled 2017-01-07 (×6): qty 1

## 2017-01-07 MED ORDER — FAMOTIDINE 20 MG PO TABS
20.0000 mg | ORAL_TABLET | Freq: Two times a day (BID) | ORAL | Status: DC
  Administered 2017-01-07 – 2017-01-12 (×12): 20 mg
  Filled 2017-01-07 (×13): qty 1

## 2017-01-07 NOTE — Plan of Care (Signed)
Pt is able to ask for help when needing the bedpan. Peri care and barrier cream also being applied.

## 2017-01-07 NOTE — ED Notes (Signed)
CBG 164  

## 2017-01-07 NOTE — ED Notes (Signed)
Pt. Removed from CPAP and put on 4L nasal canula.

## 2017-01-07 NOTE — Progress Notes (Signed)
PROGRESS NOTE    Jordan Villanueva  ZOX:[id number] DOB: 11/13/1955 DOA: 01/06/2017 PCP: System, Provider Not In    Brief Narrative:  61 year old male who presented with edema. 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 Apparently after his spinal cord injury patient was discharge to a acute long-term care facility, and then to a nursing home. Over the last few days he was noted to have generalized, severe progressive edema, complicated by lethargy. Most of the history on admission was obtained from his wife at the bedside, patient unable to give detailed history. On the initial physical examination blood pressure 139/73, heart rate 88, oxygen saturation 87%. Dry mucous membranes, heart S1-S2 present rhythmic, lungs had decreased breath sounds bilaterally, positive rails, abdomen protuberant, no masses, nontender, PEG tube in place, lower extremity with 2+ pitting edema. Sodium 142, potassium 4.3, chloride 106, bicarbonate 30, glucose 223, BUN 58, creatinine 1.71, white count 9.2, 1111.9, hematocrit 37.0, platelets 234, urine analysis negative for infection, arterial blood gas 7.43/49.7/80/35/97%. Chest x-ray with left rotation, poor inflation, bilateral interstitial infiltrates, small left pleural effusion, cardiomegaly. EKG 80 bpm, normal axis, normal intervals, poor R-wave progression.  Patient was admitted to hospital with working diagnosis of acute hypoxic respiratory failure due to cardiogenic pulmonary edema, due to decompensated acute on chronic diastolic heart failure.    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 Acute on chronic  diastolic heart failure. Will continue diuresis with furosemide 40 mg IV q12 H, target negative fluid balance, will continue carvedilol and amlodipine for blood pressure control, will follow on echocardiogram and continue telemetry monitoring.   2. Acute hypoxic respiratory failure due to cardiogenic pulmonary edema. Will continue diuresis with furosemide, oxymetry monitoring and supplemental 02 per [place].   3. Acute metabolic encephalopathy. Clinically improved, patient is more awake and orientated today. Following commands.   4. Acute kidney injury on chronic kidney disease stage 3. Serum cr at 1.7, will continue furosemide for diuresis, target negative fluids balance, serum K at 4,3 and serum bicarbonate at 30. Hypoproteinemia may have a role on generalized edema, due to decrease oncotic pressure. Noted proteinuria on UA at 100. Will consult nutrition for assessment.   5. Type 2 diabetes mellitus. Will continue glucose cover and monitoring with insulin sliding scale, plus basal dose of 28 units of glargine. Capillary glucose 182, 164, 162, 173.   6. Hypertension. Will continue blood pressure monitoring, continue amlodipine and hydralazine, systolic blood pressure 140 mmHg.   7. Quadriplegia and swallow dysfunction. DVT prophylaxis, bariatric bed. Tube feedings with aspiration precautions.   DVT prophylaxis: heparin  Code Status: full Family Communication:  Disposition Plan:    Consultants:     Procedures:     Antimicrobials:       Subjective: Patient is feeling better, dyspnea still persistent but improved in intensity, no nausea or vomiting, no fever or chills.   Objective: Vitals:   01/07/17 1130 01/07/17 1200 01/07/17 1230 01/07/17 1300  BP: (!) 142/71 (!) 146/66 (!) 147/71 (!) 142/67  Pulse: 81 82 86 84  Resp: (!) 22 (!) 24 19 (!) 21  SpO2:  92% 95% 93%  No intake or output data in the 24 hours ending 01/07/17 1333 There were no vitals filed for this  visit.  Examination:   General: Not in pain or dyspnea, deconditioned Neurology: Awake and alert, non focal  E ENT: mild pallor, no icterus, oral mucosa moist Cardiovascular: No JVD. Distant S1-S2 present, rhythmic, no gallops, rubs, or murmurs. ++++ pitting lower extremity edema. Pulmonary: decreased breath sounds bilaterally at the dependent zones, decreased air movement, no wheezing, rhonchi or rales. Gastrointestinal. Abdomen protuberant, no organomegaly, non tender, no rebound or guarding Skin. No rashes Musculoskeletal: no joint deformities     Data Reviewed: I have personally reviewed following labs and imaging studies  CBC: Recent Labs  Lab 01/04/17 0740 01/06/17 2113 01/07/17 0335  WBC 8.5 9.2 9.3  NEUTROABS  --  6.6 6.1  HGB 11.5* 11.9* 11.9*  HCT 36.5* 37.0* 37.5*  MCV 88.0 88.3 88.7  PLT 219 234 218   Basic Metabolic Panel: Recent Labs  Lab 01/04/17 0931 01/06/17 2113  NA 144 142  K 4.6 4.3  CL 106 106  CO2 31 30  GLUCOSE 170* 223*  BUN 56* 58*  CREATININE 1.45* 1.71*  CALCIUM 10.2 10.3   GFR: Estimated Creatinine Clearance: 62 mL/min (A) (by C-G formula based on SCr of 1.71 mg/dL (H)). Liver Function Tests: Recent Labs  Lab 01/06/17 2113  AST 18  ALT 21  ALKPHOS 79  BILITOT 0.6  PROT 7.0  ALBUMIN 2.8*   No results for input(s): LIPASE, AMYLASE in the last 168 hours. No results for input(s): AMMONIA in the last 168 hours. Coagulation Profile: No results for input(s): INR, PROTIME in the last 168 hours. Cardiac Enzymes: No results for input(s): CKTOTAL, CKMB, CKMBINDEX, TROPONINI in the last 168 hours. BNP (last 3 results) No results for input(s): PROBNP in the last 8760 hours. HbA1C: No results for input(s): HGBA1C in the last 72 hours. CBG: Recent Labs  Lab 01/07/17 0413 01/07/17 0803  GLUCAP 182* 164*   Lipid Profile: No results for input(s): CHOL, HDL, LDLCALC, TRIG, CHOLHDL, LDLDIRECT in the last 72 hours. Thyroid Function  Tests: Recent Labs    01/06/17 2118  TSH 1.784   Anemia Panel: No results for input(s): VITAMINB12, FOLATE, FERRITIN, TIBC, IRON, RETICCTPCT in the last 72 hours.    Radiology Studies: I have reviewed all of the imaging during this hospital visit personally     Scheduled Meds: . amLODipine  10 mg Per Tube Daily  . aspirin  81 mg Oral Daily  . atorvastatin  20 mg Per Tube QPM  . calcium carbonate  1,250 mg Per Tube Q breakfast  . carvedilol  12.5 mg Per Tube BID WC  . famotidine  20 mg Per Tube BID  . furosemide  40 mg Intravenous BID  . gabapentin  400 mg Per Tube TID  . heparin  5,000 Units Subcutaneous Q8H  . hydrALAZINE  20 mg Per Tube Q8H  . insulin aspart  0-9 Units Subcutaneous Q4H  . insulin glargine  28 Units Subcutaneous QHS  . ondansetron  4 mg Intravenous Once  . sodium chloride flush  3 mL Intravenous Q12H   Continuous Infusions: . sodium chloride       LOS: 0 days        Jordan Villanueva Annett Gulaaniel Meara Wiechman, MD [facility] Pager [phone]

## 2017-01-07 NOTE — ED Notes (Signed)
RT paged per ISTAT ABG. MD states he did not want one at this time.

## 2017-01-07 NOTE — ED Notes (Signed)
Attempted to call report

## 2017-01-07 NOTE — ED Notes (Signed)
Echo at bedside

## 2017-01-07 NOTE — H&P (Addendum)
History and Physical    Jordan Villanueva ZOX:[id number] DOB: 01/03/1956 DOA: 01/06/2017  Referring MD/NP/PA:  Haynes Kerns, PA-C PCP: System, Provider Not In  Patient coming from:    Chief Complaint: Swelling  I personally briefly reviewed patient's previous hospital records.    HPI: Jordan Villanueva is a 61 y.o. 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  Patient's wife provides history as he is unable to provide his own.  She notes that after he was discharged from the hospital after his fall with central cord syndrome that he was sent to [facility], but only stayed there approximately 3 weeks.  At that time insurance would not pay for any continued stay and recommended transfer to a nursing home.  Wife notes that he was not able to utilize his hands at that time and ended up at [facility].  Since being transferred she is unsure if he is receiving his medications as he previously was receiving at the [facility].  Nursing staff recognize that he had whole body swelling and was more lethargic than normal.  He is supposed to be on CPAP at night and she is unsure if this is being done.  Prior to his hospitalization back in September patient had only required oxygen at night.  ED Course: Upon admission into the emergency department patient was noted to be afebrile, pulse 80-91, respirations 13-18, blood pressures maintained, and O2 saturations maintained on 4 L nasal cannula oxygen.  Labs revealed WBC 9.2, hemoglobin 11.9, BUN 58, creatinine 1.71.  Initial ABG revealed pH of 7.431, PCO2 49.7, PO2 88% on 4 L nasal cannula oxygen.  Review of Systems  Unable to perform ROS: Mental status change  Constitutional:       Positive for weight gain  Cardiovascular: Positive for leg swelling.    Genitourinary: Negative for dysuria and frequency.    History reviewed. No pertinent past medical history.  Past Surgical History:  Procedure Laterality Date  . IR GASTROSTOMY TUBE MOD SED  12/20/2016     reports that  has never smoked. he has never used smokeless tobacco. His alcohol and drug histories are not on file.  No Known Allergies  History reviewed.  Family history significant for obesity  Prior to Admission medications   Medication Sig Start Date End Date Taking? Authorizing Provider  amLODipine (NORVASC) 10 MG tablet Place 10 mg daily into feeding tube.   Yes [provider]  aspirin EC 81 MG tablet Give 1 tablet daily via G-Tube   Yes [provider]  atorvastatin (LIPITOR) 20 MG tablet Place 20 mg every evening into feeding tube.   Yes [provider]  calcium carbonate 1250 MG capsule Place 1,250 mg daily into feeding tube.   Yes [provider]  carvedilol (COREG) 12.5 MG tablet Place 12.5 mg 2 (two) times daily with a meal into feeding tube.   Yes [provider]  cholecalciferol (VITAMIN D) 1000 units tablet Give 1 tablet via G-tube daily   Yes [provider]  cyanocobalamin 1000 MCG tablet Place 1,000 mcg daily into feeding tube.   Yes [provider]  famotidine (PEPCID) 20 MG tablet Place 20 mg 2 (two) times daily into feeding tube.   Yes [provider]  furosemide (LASIX) 40 MG tablet Place 40 mg daily  into feeding tube.   Yes [provider]  gabapentin (NEURONTIN) 400 MG capsule Place 400 mg 3 (three) times daily into feeding tube.   Yes [provider]  heparin 5000 UNIT/ML injection Inject 5,000 Units every 8 (eight) hours into the skin.   Yes [provider]  hydrALAZINE (APRESOLINE) 10 MG tablet Place 20 mg 3 (three) times daily into feeding tube.   Yes [provider]  insulin glargine (LANTUS) 100 UNIT/ML injection Inject 28 Units at bedtime into  the skin.   Yes [provider]  insulin lispro (HUMALOG) 100 UNIT/ML injection Inject as per sliding scale subcutaneously every 6 hours 200 - 250 = 2 units 251 - 300 = 4 units 301 - 400 = 6 units Greater than 400 call MD   Yes [provider]  Multiple Vitamin (MULTIVITAMIN) tablet Place 1 tablet daily into feeding tube.   Yes [provider]  Nutritional Supplements (NUTRITIONAL DRINK) LIQD Administer Sugar Free Med pass per GT via Pump.  Rate 66ml/hr for 24 hours a day   Yes [provider]  oxyCODONE (ROXICODONE) 5 MG immediate release tablet Place 5 mg every 6 (six) hours as needed into feeding tube for severe pain.   Yes [provider]  polyethylene glycol (MIRALAX / GLYCOLAX) packet Place 17 g daily into feeding tube.   Yes [provider]  risperiDONE (RISPERDAL) 1 MG tablet Place 1 mg 2 (two) times daily into feeding tube.   Yes [provider]  senna (SENOKOT) 8.6 MG tablet Place 2 tablets daily into feeding tube.   Yes [provider]  UNABLE TO FIND Enternal feed order every 12 hours Bolus with 50 ml of water every 2 hours for hydration and tube patency.  Flush every shift with 30-60 ml water before and after meds, before initiating feedings or when there is an interruption of feeding to maintain tube patency.  Flush every shift with 5-68ml water between each medication   Yes [provider]    Physical Exam:  Constitutional: Obese male who appears very lethargic and is arousable for short periods of time Vitals:   01/07/17 0000 01/07/17 0030 01/07/17 0045 01/07/17 0103  BP: 139/73 132/71 (!) 143/71   Pulse: 88 86 91 85  Resp:      SpO2: 92% 94% (!) 87% 95%   Eyes: P legally blind at baseline.  ENMT: Mucous membranes are dry. Posterior pharynx clear of any exudate or lesions. Neck: normal, supple, no masses, no thyromegaly Respiratory: Decreased overall aeration with positive crackles  appreciated currently on 4 L nasal cannula oxygen. Cardiovascular: Regular rate and rhythm, no murmurs / rubs / gallops.  +2 upper and lower extremity edema. 2+ pedal pulses. No carotid bruits.  Abdomen: Protuberant abdomen with PEG tube present. Musculoskeletal: no clubbing / cyanosis. No joint deformity upper and lower extremities. Good ROM, no contractures. Normal muscle tone.  Skin: no rashes, lesions, ulcers. No induration Neurologic: Patient has movement of his neck and head, but appears to be plegic that the rest of his body Psychiatric: Lethargic    Labs on Admission: I have personally reviewed following labs and imaging studies  CBC: Recent Labs  Lab 01/04/17 0740 01/06/17 2113  WBC 8.5 9.2  NEUTROABS  --  6.6  HGB 11.5* 11.9*  HCT 36.5* 37.0*  MCV 88.0 88.3  PLT 219 234   Basic Metabolic Panel: Recent Labs  Lab 01/04/17 0931 01/06/17 2113  NA 144 142  K 4.6  4.3  CL 106 106  CO2 31 30  GLUCOSE 170* 223*  BUN 56* 58*  CREATININE 1.45* 1.71*  CALCIUM 10.2 10.3   GFR: Estimated Creatinine Clearance: 62 mL/min (A) (by C-G formula based on SCr of 1.71 mg/dL (H)). Liver Function Tests: Recent Labs  Lab 01/06/17 2113  AST 18  ALT 21  ALKPHOS 79  BILITOT 0.6  PROT 7.0  ALBUMIN 2.8*   No results for input(s): LIPASE, AMYLASE in the last 168 hours. No results for input(s): AMMONIA in the last 168 hours. Coagulation Profile: No results for input(s): INR, PROTIME in the last 168 hours. Cardiac Enzymes: No results for input(s): CKTOTAL, CKMB, CKMBINDEX, TROPONINI in the last 168 hours. BNP (last 3 results) No results for input(s): PROBNP in the last 8760 hours. HbA1C: No results for input(s): HGBA1C in the last 72 hours. CBG: No results for input(s): GLUCAP in the last 168 hours. Lipid Profile: No results for input(s): CHOL, HDL, LDLCALC, TRIG, CHOLHDL, LDLDIRECT in the last 72 hours. Thyroid Function Tests: No results for input(s): TSH, T4TOTAL, FREET4,  T3FREE, THYROIDAB in the last 72 hours. Anemia Panel: No results for input(s): VITAMINB12, FOLATE, FERRITIN, TIBC, IRON, RETICCTPCT in the last 72 hours. Urine analysis:    Component Value Date/Time   COLORURINE AMBER (A) 12/18/2016 1455   APPEARANCEUR CLOUDY (A) 12/18/2016 1455   LABSPEC 1.015 12/18/2016 1455   PHURINE 5.0 12/18/2016 1455   GLUCOSEU 50 (A) 12/18/2016 1455   HGBUR NEGATIVE 12/18/2016 1455   BILIRUBINUR NEGATIVE 12/18/2016 1455   KETONESUR NEGATIVE 12/18/2016 1455   PROTEINUR >=300 (A) 12/18/2016 1455   NITRITE NEGATIVE 12/18/2016 1455   LEUKOCYTESUR LARGE (A) 12/18/2016 1455   Sepsis Labs: No results found for this or any previous visit (from the past 240 hour(s)).   Radiological Exams on Admission: Dg Chest 2 View  Result Date: 01/06/2017 CLINICAL DATA:  Shortness of breath. EXAM: CHEST  2 VIEW COMPARISON:  Chest radiograph 12/14/2016. FINDINGS: Patient is rotated to the left. Anterior cervical spinal fusion hardware. Marked cardiomegaly. Monitoring leads overlie the patient. Pulmonary vascular redistribution. Bilateral hazy pulmonary opacities. Small left pleural effusion. IMPRESSION: Findings suggestive of mild interstitial edema and possible left pleural effusion. Cardiomegaly. Limited exam. Electronically Signed   By: Annia Beltrew  Davis M.D.   On: 01/06/2017 21:55    EKG: Independently reviewed.  Sinus rhythm similar to previous tracing  Assessment/Plan Anasarca with suspected CHF exacerbation: Acute.  Patient presents with generalized edema of the upper and lower extremities.  Chest x-ray showing cardiomegaly with signs of edema.  BMP noted to be only 31, but this could be falsely low due to patient's obesity. In the differential also includes the possibility of hypoalbuminemia contributing to peripheral edema. - Admit to a stepdown bed - Heart failure orders set  initiated  - Continuous pulse oximetry with nasal cannula oxygen as needed to keep O2 saturations >92% -  Strict I&Os and daily weights - Elevate lower extremities - Lasix 40 mg IV Bid - Reassess in a.m. and adjust diuresis as needed. - Check echocardiogram - Optimize medical management as able - May warrant consultation to cardiology in a.m.   Respiratory failure: Based off history from wife he had only used oxygen at night until after his hospitalization in 10/2016.   At the nursing home patient had been noted to be on 4 L of oxygen. - Continuous pulse oximetry with nasal cannula oxygen as needed - Duonebs prn SOB/Wheezing  Acute encephalopathy: Patient appears more lethargic  than normal.  Initial ABG did not show significant signs of hypercapnia.  Question of symptoms secondary to medication effect versus infection. - Neurochecks - Check TSH - Hold sedating medications  Acute kidney injury on chronic kidney disease: Baseline creatinine previously noted to be around 1.2-1.4, but patient presents with a creatinine of 1.71 BUN 58.  Suspect possibility of hypoperfusion due to patient being fluid overloaded.  - Recheck BMP in a.m.  Diabetes mellitus type 2: Last hemoglobin A1c noted to be 6.9 on 12/15/2016 - Hypoglycemic protocol - Continue Lantus 28 units at at bedtime - CBGs q6 hrs with sensitive SSI  Essential hypertension: Blood pressures maintained.  - continue Coreg, hydralazine, amlodipine as tolerated  Dysphasia status post PEG tube - NPO - Continue tube feeds and medications per PEG  Chronic indwelling Foley - check UA   Dementia - Held Risperdal, restart when medically appropriate  Protein calorie malnutrition: Patient's initial albumin noted to be 2.8 - Check prealbumin  Quadriplegia 2/2 spinal cord injury: Stable  OSA on CPAP - Continue CPAP at night   DVT prophylaxis: heparin   Code Status: Full Family Communication: Discussed plan of care with patient and family present at bedside Disposition Plan: SNF  Consults called: none Admission status:  inpatient  Clydie Braunondell A Smith MD [facility] Pager [phone]   If 7PM-7AM, please contact night-coverage [email or web address] Password [email or web address]  01/07/2017, 1:08 AM

## 2017-01-08 ENCOUNTER — Inpatient Hospital Stay (HOSPITAL_COMMUNITY): Payer: Medicare Other

## 2017-01-08 DIAGNOSIS — E8770 Fluid overload, unspecified: Secondary | ICD-10-CM

## 2017-01-08 DIAGNOSIS — I5030 Unspecified diastolic (congestive) heart failure: Secondary | ICD-10-CM

## 2017-01-08 LAB — BASIC METABOLIC PANEL
Anion gap: 8 (ref 5–15)
BUN: 68 mg/dL — AB (ref 6–20)
CALCIUM: 9.9 mg/dL (ref 8.9–10.3)
CO2: 30 mmol/L (ref 22–32)
CREATININE: 1.91 mg/dL — AB (ref 0.61–1.24)
Chloride: 106 mmol/L (ref 101–111)
GFR calc Af Amer: 41 mL/min — ABNORMAL LOW (ref >60)
GFR, EST NON AFRICAN AMERICAN: 36 mL/min — AB (ref >60)
GLUCOSE: 128 mg/dL — AB (ref 65–99)
Potassium: 4.3 mmol/L (ref 3.5–5.1)
SODIUM: 144 mmol/L (ref 135–145)

## 2017-01-08 LAB — ECHOCARDIOGRAM COMPLETE
HEIGHTINCHES: 70 in
WEIGHTICAEL: 6432 [oz_av]

## 2017-01-08 LAB — GLUCOSE, CAPILLARY
GLUCOSE-CAPILLARY: 126 mg/dL — AB (ref 65–99)
GLUCOSE-CAPILLARY: 131 mg/dL — AB (ref 65–99)
Glucose-Capillary: 115 mg/dL — ABNORMAL HIGH (ref 65–99)
Glucose-Capillary: 128 mg/dL — ABNORMAL HIGH (ref 65–99)
Glucose-Capillary: 131 mg/dL — ABNORMAL HIGH (ref 65–99)
Glucose-Capillary: 135 mg/dL — ABNORMAL HIGH (ref 65–99)
Glucose-Capillary: 148 mg/dL — ABNORMAL HIGH (ref 65–99)

## 2017-01-08 MED ORDER — JEVITY 1.2 CAL PO LIQD: 1000.0000 mL | ORAL | Status: DC

## 2017-01-08 MED ORDER — VITAL AF 1.2 CAL PO LIQD
1000.0000 mL | ORAL | Status: DC
  Administered 2017-01-08 – 2017-01-10 (×3): 1000 mL
  Filled 2017-01-08 (×6): qty 1000

## 2017-01-08 MED ORDER — FUROSEMIDE 8 MG/ML PO SOLN
40.0000 mg | Freq: Every day | ORAL | Status: DC
  Filled 2017-01-08: qty 5

## 2017-01-08 MED ORDER — PERFLUTREN LIPID MICROSPHERE
INTRAVENOUS | Status: AC
  Administered 2017-01-08: 5 mL via INTRAVENOUS
  Filled 2017-01-08: qty 10

## 2017-01-08 MED ORDER — PRO-STAT SUGAR FREE PO LIQD
60.0000 mL | Freq: Two times a day (BID) | ORAL | Status: DC
  Administered 2017-01-08 – 2017-01-12 (×8): 60 mL via ORAL
  Filled 2017-01-08 (×8): qty 60

## 2017-01-08 MED ORDER — FREE WATER
200.0000 mL | Freq: Three times a day (TID) | Status: DC
  Administered 2017-01-08 – 2017-01-12 (×13): 200 mL

## 2017-01-08 MED ORDER — NEPRO/CARBSTEADY PO LIQD
237.0000 mL | ORAL | Status: DC
  Filled 2017-01-08 (×2): qty 237

## 2017-01-08 MED ORDER — PERFLUTREN LIPID MICROSPHERE
1.0000 mL | INTRAVENOUS | Status: AC | PRN
  Administered 2017-01-08: 5 mL via INTRAVENOUS
  Filled 2017-01-08: qty 10

## 2017-01-08 MED ORDER — TRAZODONE HCL 50 MG PO TABS
50.0000 mg | ORAL_TABLET | Freq: Once | ORAL | Status: AC
  Administered 2017-01-08: 50 mg via ORAL
  Filled 2017-01-08: qty 1

## 2017-01-08 NOTE — Progress Notes (Signed)
PROGRESS NOTE    Jordan Villanueva  ZOX:[id number] DOB: 08/15/1955 DOA: 01/06/2017 PCP: System, Provider Not In    Brief Narrative:  61 year old male who presented with edema. 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 Apparently after his spinal cord injury patient was discharge to a acute long-term care facility, and then to a nursing home. Over the last few days he was noted to have generalized, severe progressive edema, complicated by lethargy. Most of the history on admission was obtained from his wife at the bedside, patient unable to give detailed history. On the initial physical examination blood pressure 139/73, heart rate 88, oxygen saturation 87%. Dry mucous membranes, heart S1-S2 present rhythmic, lungs had decreased breath sounds bilaterally, positive rails, abdomen protuberant, no masses, nontender, PEG tube in place, lower extremity with 2+ pitting edema. Sodium 142, potassium 4.3, chloride 106, bicarbonate 30, glucose 223, BUN 58, creatinine 1.71, white count 9.2, 1111.9, hematocrit 37.0, platelets 234, urine analysis negative for infection, arterial blood gas 7.43/49.7/80/35/97%. Chest x-ray with left rotation, poor inflation, bilateral interstitial infiltrates, small left pleural effusion, cardiomegaly. EKG 80 bpm, normal axis, normal intervals, poor R-wave progression.  Patient was admitted to hospital with working diagnosis of acute hypoxic respiratory failure due to cardiogenic pulmonary edema, due to decompensated acute on chronic diastolic heart failure.    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 Acute on  chronic diastolic heart failure. Patient seems to be more euvolemic, will continue current po furosemide to target further negative fluid balance. Urine output over last 24 hours, 1500 ml. Will continue coreg and hydralazine, follow on echocardiography.   2. Acute hypoxic respiratory failure due to cardiogenic pulmonary edema. Continue diuresis with IV furosemide, supplemental 02 per .  Oxymetry on 3 lpm is 97%. Will check portable chest film to assess pulmonary edema.   3. Acute metabolic encephalopathy. Patient awake and alert, episodic confusion but able to respond simple questions.   4. Acute kidney injury on chronic kidney disease stage 3. Worsening renal function with serum cr up to 1,9, with K at 4,3 and serum bicarbonate at 30, will decrease dose of furosemide and will follow on renal panel in am, will avoid hypotension or nephrotoxic medications. Possible intravascular volume repletion.   5. Type 2 diabetes mellitus. Continue glucose cover and monitoring with insulin sliding scale, plus basal insulin glargine (28 units). Fasting glucose 128, with capillary glucose 126, 131, 115, 128.   6. Hypertension. On amlodipine and hydralazine, for blood pressure control, systolic 120 to 140.   7. Quadriplegia and swallow dysfunction. Old records personally reviewed, patient has po intake in 09/18 with aspiration precautions, will resume tube feedings per nutrition recommendations and will request bedside swallow evaluation, ice chips for now.   DVT prophylaxis: heparin  Code Status: full Family Communication:  Disposition Plan:    Consultants:     Procedures:     Antimicrobials:     Subjective: Patient is feeling better, dyspnea has improved, no chest pain, no nausea or vomiting, patient requesting water per mouth, unclear about his tube feedings.   Objective: Vitals:   01/08/17 [id number] 01/08/17 0537 01/08/17 [id number]  01/08/17 1114  BP: 132/65 136/70 127/64 (!) 142/73    Pulse: 66  70 65  Resp: 14  16 16   Temp: 98.9 F (37.2 C)  97.8 F (36.6 C) 97.6 F (36.4 C)  TempSrc: Axillary  Oral Oral  SpO2: 97%  96% 97%  Weight: (!) 182.3 kg (402 lb)     Height:        Intake/Output Summary (Last 24 hours) at 01/08/2017 1340 Last data filed at 01/08/2017 1300 Gross per 24 hour  Intake 83 ml  Output 2050 ml  Net -1967 ml   Filed Weights   01/08/17 0315  Weight: (!) 182.3 kg (402 lb)    Examination:   General: Not in pain or dyspnea, deconditioned Neurology: Awake and alert, non focal  E ENT: mild pallor, no icterus, oral mucosa dry Cardiovascular: wide neck with no JVD. S1-S2 present, rhythmic, no gallops, rubs, or murmurs. Trace lower extremity edema. Pulmonary: vesicular breath sounds bilaterally, adequate air movement, no wheezing, rhonchi or rales. Decreased breath sounds at the dependent zones, with adequate air entry at the apical zones on supine position.  Gastrointestinal. Abdomen protuberant, no organomegaly, non tender, no rebound or guarding Skin. No rashes Musculoskeletal: no joint deformities     Data Reviewed: I have personally reviewed following labs and imaging studies  CBC: Recent Labs  Lab 01/04/17 0740 01/06/17 2113 01/07/17 0335  WBC 8.5 9.2 9.3  NEUTROABS  --  6.6 6.1  HGB 11.5* 11.9* 11.9*  HCT 36.5* 37.0* 37.5*  MCV 88.0 88.3 88.7  PLT 219 234 218   Basic Metabolic Panel: Recent Labs  Lab 01/04/17 0931 01/06/17 2113 01/08/17 0253  NA 144 142 144  K 4.6 4.3 4.3  CL 106 106 106  CO2 31 30 30   GLUCOSE 170* 223* 128*  BUN 56* 58* 68*  CREATININE 1.45* 1.71* 1.91*  CALCIUM 10.2 10.3 9.9   GFR: Estimated Creatinine Clearance: 65.3 mL/min (A) (by C-G formula based on SCr of 1.91 mg/dL (H)). Liver Function Tests: Recent Labs  Lab 01/06/17 2113  AST 18  ALT 21  ALKPHOS 79  BILITOT 0.6  PROT 7.0  ALBUMIN 2.8*   No results for input(s): LIPASE, AMYLASE in the last 168 hours. No results for  input(s): AMMONIA in the last 168 hours. Coagulation Profile: No results for input(s): INR, PROTIME in the last 168 hours. Cardiac Enzymes: No results for input(s): CKTOTAL, CKMB, CKMBINDEX, TROPONINI in the last 168 hours. BNP (last 3 results) No results for input(s): PROBNP in the last 8760 hours. HbA1C: No results for input(s): HGBA1C in the last 72 hours. CBG: Recent Labs  Lab 01/07/17 2017 01/08/17 0029 01/08/17 0314 01/08/17 0758 01/08/17 1119  GLUCAP 133* 126* 131* 115* 128*   Lipid Profile: No results for input(s): CHOL, HDL, LDLCALC, TRIG, CHOLHDL, LDLDIRECT in the last 72 hours. Thyroid Function Tests: Recent Labs    01/06/17 2118  TSH 1.784   Anemia Panel: No results for input(s): VITAMINB12, FOLATE, FERRITIN, TIBC, IRON, RETICCTPCT in the last 72 hours.    Radiology Studies: I have reviewed all of the imaging during this hospital visit personally     Scheduled Meds: . amLODipine  10 mg Per Tube Daily  . aspirin  81 mg Oral Daily  . atorvastatin  20 mg Per Tube QPM  . calcium carbonate  1,250 mg Per Tube Q breakfast  . carvedilol  12.5 mg Per Tube BID WC  . famotidine  20 mg Per Tube BID  .  furosemide  40 mg Intravenous BID  . gabapentin  400 mg Per Tube TID  . heparin  5,000 Units Subcutaneous Q8H  . hydrALAZINE  20 mg Per Tube Q8H  . insulin aspart  0-9 Units Subcutaneous Q4H  . insulin glargine  28 Units Subcutaneous QHS  . ondansetron  4 mg Intravenous Once  . sodium chloride flush  3 mL Intravenous Q12H   Continuous Infusions: . sodium chloride       LOS: 1 day        Jordan Villanueva Jordan Gulaaniel Vallery Mcdade, MD [facility] Pager [phone]

## 2017-01-08 NOTE — Progress Notes (Signed)
Initial Nutrition Assessment  DOCUMENTATION CODES:   Morbid obesity  INTERVENTION:   -Initiate Vital AF 1.2 @ 20 ml/hr via PEG and increase by 10 ml every 4 hours to goal rate of 40 ml/hr.   60 ml Prostat BID.    Tube feeding regimen provides 1652 kcal (100% of needs), 132 grams of protein, and 779 ml of H2O.   NUTRITION DIAGNOSIS:   Inadequate oral intake related to inability to eat as evidenced by NPO status.  GOAL:   Patient will meet greater than or equal to 90% of their needs  MONITOR:   Labs, Weight trends, Skin, I & O's, TF tolerance  REASON FOR ASSESSMENT:   Consult Assessment of nutrition requirement/status  ASSESSMENT:   Jordan Villanueva is a 61 y.o. 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   Pt admitted with anasarca with CHF exacerbation.   Pt somnolent at time of visit. Did not arouse when performing exam. No family members available to provide additional hx.   Case discussed with RN; pt is upset and requesting food. TF not running currently- per RN no contraindications to start TF today. Per PTA medication list, PTA TF regimen is Medpass @ 50 ml/hr,providing 2400 kcals daily, which exceeds nutritional needs  Received verbal order from Dr. Ella JubileeArrien to initiate TF at 1230.   Labs reviewed: CBGS: 115-131 (inpatient orders for glycemic control are 0-9 units insulin aspart every 4 hours and 28 units insulin glargine daily).   NUTRITION - FOCUSED PHYSICAL EXAM:    Most Recent Value  Orbital Region  No depletion  Upper Arm Region  No depletion  Thoracic and Lumbar Region  No depletion  Buccal Region  No depletion  Temple Region  No depletion  Clavicle Bone Region  No depletion  Clavicle and Acromion Bone Region  No depletion  Scapular Bone Region  No  depletion  Dorsal Hand  No depletion  Patellar Region  No depletion  Anterior Thigh Region  No depletion  Posterior Calf Region  No depletion  Edema (RD Assessment)  Moderate  Hair  Reviewed  Eyes  Reviewed  Mouth  Reviewed  Skin  Reviewed  Nails  Reviewed       Diet Order:  Diet NPO time specified  EDUCATION NEEDS:   No education needs have been identified at this time  Skin:  Skin Assessment: Reviewed RN Assessment  Last BM:  01/06/17  Height:   Ht Readings from Last 1 Encounters:  01/07/17 5\' 10"  (1.778 m)    Weight:   Wt Readings from Last 1 Encounters:  01/08/17 (!) 402 lb (182.3 kg)    Ideal Body Weight:  66 kg  BMI:  Body mass index is 57.68 kg/m.  Estimated Nutritional Needs:   Kcal:  1450-1650  Protein:  130-145 grams  Fluid:  > 1.5 L    Jordan Villanueva, RD, LDN, CDE Pager: [phone] After hours Pager: [phone]

## 2017-01-08 NOTE — Progress Notes (Signed)
  Echocardiogram 2D Echocardiogram has been performed.  Delcie RochENNINGTON, Analaya Hoey 01/08/2017, 9:40 AM

## 2017-01-09 ENCOUNTER — Other Ambulatory Visit: Payer: Self-pay

## 2017-01-09 ENCOUNTER — Inpatient Hospital Stay (HOSPITAL_COMMUNITY): Payer: Medicare Other

## 2017-01-09 LAB — GLUCOSE, CAPILLARY
GLUCOSE-CAPILLARY: 143 mg/dL — AB (ref 65–99)
GLUCOSE-CAPILLARY: 154 mg/dL — AB (ref 65–99)
GLUCOSE-CAPILLARY: 161 mg/dL — AB (ref 65–99)
Glucose-Capillary: 178 mg/dL — ABNORMAL HIGH (ref 65–99)
Glucose-Capillary: 152 mg/dL — ABNORMAL HIGH (ref 65–99)

## 2017-01-09 LAB — BASIC METABOLIC PANEL
ANION GAP: 9 (ref 5–15)
BUN: 63 mg/dL — ABNORMAL HIGH (ref 6–20)
CHLORIDE: 104 mmol/L (ref 101–111)
CO2: 31 mmol/L (ref 22–32)
Calcium: 9.6 mg/dL (ref 8.9–10.3)
Creatinine, Ser: 1.54 mg/dL — ABNORMAL HIGH (ref 0.61–1.24)
GFR, EST AFRICAN AMERICAN: 54 mL/min — AB (ref >60)
GFR, EST NON AFRICAN AMERICAN: 46 mL/min — AB (ref >60)
Glucose, Bld: 147 mg/dL — ABNORMAL HIGH (ref 65–99)
POTASSIUM: 3.5 mmol/L (ref 3.5–5.1)
SODIUM: 144 mmol/L (ref 135–145)

## 2017-01-09 MED ORDER — RISPERIDONE 1 MG PO TABS
1.0000 mg | ORAL_TABLET | Freq: Two times a day (BID) | ORAL | Status: DC
  Administered 2017-01-09 – 2017-01-12 (×7): 1 mg via ORAL
  Filled 2017-01-09 (×7): qty 1

## 2017-01-09 MED ORDER — MORPHINE SULFATE (PF) 2 MG/ML IV SOLN
2.0000 mg | INTRAVENOUS | Status: AC | PRN
  Administered 2017-01-09 (×2): 2 mg via INTRAVENOUS
  Filled 2017-01-09 (×2): qty 1

## 2017-01-09 MED ORDER — FUROSEMIDE 40 MG PO TABS
40.0000 mg | ORAL_TABLET | Freq: Every day | ORAL | Status: DC
  Administered 2017-01-09 – 2017-01-12 (×4): 40 mg
  Filled 2017-01-09 (×3): qty 1

## 2017-01-09 NOTE — Evaluation (Signed)
Clinical/Bedside Swallow Evaluation Patient Details  Name: Jordan Villanueva MRN: [id number] Date of Birth: 03/23/1955  Today's Date: 01/09/2017 Time: SLP Start Time (ACUTE ONLY): [id number] SLP Stop Time (ACUTE ONLY): 0835 SLP Time Calculation (min) (ACUTE ONLY): 24 min  Past Medical History: History reviewed. No pertinent past medical history. Past Surgical History:  Past Surgical History:  Procedure Laterality Date  . IR GASTROSTOMY TUBE MOD SED  12/20/2016   HPI:  61 year old male who presented with edema. 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 He is s/p C3-4 anterior cervical diskectomy and fusion. Apparently after his spinal cord injury patientwasdischarged to a acute long-term care facility, andthen to a nursing home.Over the last few days he was noted to have generalized, severe progressive edema,complicated by lethargy. [facility] care [facility] revealing penetration with thin, nectar and puree. Full report not available but exam limited due to overlying anatomy restricting view of pt's upper airway. Per notes pt work with SLP, planning to repeat FEES 12/16/16 however no report found; appears he was d/c prior to completing.   Assessment / Plan / Recommendation Clinical Impression  Patient presents with moderate-severe risk for aspiration due to prolonged NPO and bedbound status, quadriplegia, and ACDF. At bedside with teaspoons of ice, thin water, pt appears to have timely swallow with adequate airway protection. Given his risk factors for aspiration, recommend proceeding with instrumental testing to determine if initiating PO diet is appropriate. As pt [facility] and was preparing to repeat FEES at time of d/c, anticipate that he has made some improvements in swallow function. Will follow up next  date for FEES. Recommend he remain NPO for now, though he may have ice chips after oral care.    SLP Visit Diagnosis: Dysphagia, unspecified (R13.10)    Aspiration Risk  Moderate aspiration risk    Diet Recommendation NPO;Ice chips PRN after oral care   Liquid Administration via: Spoon Medication Administration: Via alternative means Supervision: Full supervision/cueing for compensatory strategies Compensations: Small sips/bites;Slow rate Postural Changes: Seated upright at 90 degrees    Other  Recommendations Oral Care Recommendations: Oral care QID;Oral care prior to ice chip/H20   Follow up Recommendations Other (comment)(TBD)      Frequency and Duration min 2x/week  2 weeks       Prognosis Prognosis for Safe Diet Advancement: Good Barriers to Reach Goals: Time post onset;Cognitive deficits      Swallow Study   General Date of Onset: 01/06/17 HPI: 61 year old male who presented with edema. 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 He is s/p C3-4 anterior cervical diskectomy and fusion. Apparently after his spinal cord injury patientwasdischarged to a acute long-term care facility, andthen to a nursing home.Over the last few days he was noted to have generalized, severe progressive edema,complicated by lethargy. [facility] care [facility] revealing penetration with thin, nectar and puree. Full report not available but exam limited due to overlying anatomy restricting view of pt's upper airway. Per notes pt work with SLP, planning to repeat FEES 12/16/16 however no report found; appears he was d/c prior to completing. Type of Study: Bedside Swallow Evaluation Previous Swallow Assessment: see HPI Diet Prior to this Study: NPO Temperature Spikes Noted: No Respiratory Status: Nasal  cannula History of Recent Intubation:  No Behavior/Cognition: Alert;Cooperative;Confused Oral Cavity Assessment: Within Functional Limits Oral Care Completed by SLP: Yes Oral Cavity - Dentition: Adequate natural dentition;Missing dentition Vision: Impaired for self-feeding Self-Feeding Abilities: Needs assist Patient Positioning: Upright in bed Baseline Vocal Quality: Normal Volitional Cough: Weak Volitional Swallow: Able to elicit    Oral/Motor/Sensory Function Overall Oral Motor/Sensory Function: Within functional limits   Ice Chips Ice chips: Within functional limits Presentation: Spoon   Thin Liquid Thin Liquid: Within functional limits Presentation: Spoon    Nectar Thick Nectar Thick Liquid: Not tested   Honey Thick Honey Thick Liquid: Not tested   Puree Puree: Not tested   Solid   GO   Solid: Not tested        Jordan Villanueva 01/09/2017,8:53 AM   Rondel BatonMary Beth Saniah Schroeter, MS, CCC-SLP Speech-Language Pathologist [phone]

## 2017-01-09 NOTE — Progress Notes (Signed)
PROGRESS NOTE    Aris LotRobert Ramer  WUJ:[id number] DOB: 09/30/1955 DOA: 01/06/2017 PCP: System, Provider Not In    Brief Narrative:  61 year old male who presented with edema. 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 Apparently after his spinal cord injury patientwasdischarge to a acute long-term care facility, andthen to a nursing home.Over the last few days he was noted to have generalized, severe progressive edema,complicated by lethargy. Most of the history on admission was obtained from his wife at the bedside,patient unable to give detailed history.On the initial physical examination blood pressure 139/73, heart rate 88, oxygen saturation 87%.Dry mucous membranes, heart S1-S2 present rhythmic, lungs had decreased breath sounds bilaterally,positive rails,abdomen protuberant, no masses, nontender, PEG tube in place, lower extremity with 2+ pitting edema.Sodium 142, potassium 4.3, chloride 106, bicarbonate 30, glucose 223, BUN 58, creatinine 1.71,white count 9.2, 1111.9, hematocrit 37.0, platelets 234,urine analysis negative for infection,arterial blood gas 7.43/49.7/80/35/97%.Chest x-ray with left rotation, poor inflation,bilateral interstitial infiltrates,small left pleural effusion,cardiomegaly.EKG 80 bpm, normal axis, normal intervals, poor R-wave progression.  Patient was admitted to hospital with working diagnosis of acute hypoxic respiratory failure due to cardiogenic pulmonary edema,due to decompensated acute on chronic diastolic heart failure.     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 Urine output 2,250 over last 24 hours with improvement of his symptoms. Will continue furosemide at current dosing. Continue coreg and hydralazine. Continue asa and statin therapy.   2.Acute hypoxic respiratory failure due to cardiogenic pulmonary edema. Follow chest film personally reviewed and noted improvement of edema, will continue oxymetry monitoring and supplemental 02 per [place].   3.Acute metabolic encephalopathy. Improved, suspected to be at baseline, will continue neuro checks per unit protocol.  4.Acute kidney injury on chronic kidney diseasestage 3. Serm cr trending down to 1,54 from 1,91, K at 3,5, will continue current regimen of diuresis, will follow renal panel in am, avoid hypotension or nephrotoxic medications.  5.Type 2 diabetes mellitus. Glucose cover and monitoring with insulin sliding scale, and basal insulin glargine (28 units). Capillary glucose 135, 148, 143, 154, 178. Tolerating well tube feedings.    6.Hypertension. Continue amlodipine and hydralazine, with good toleration.  7.Quadriplegia and swallow dysfunction. Will continue npo per speech recommendations, only ice chips and continue tube feedings. DVT prophylaxis, will resume risperidone and continue trazodone.   DVT prophylaxis:heparin Code Status:full Family Communication: Disposition Plan:   Consultants:    Procedures:    Antimicrobials:    Subjective: Patient feeling better, dyspnea and edema improving, no confusion or agitation. Tolerating tube feedings, had difficulty sleeping last night.   Objective: Vitals:   01/08/17 2213 01/08/17 2330 01/09/17 0333 01/09/17 0746  BP: 130/70 138/67 131/69 132/70  Pulse:  67 64 69  Resp:  14 10 20   Temp:  (!) 97 F (36.1 C) (!) 97 F (36.1 C) 98.8 F (37.1 C)  TempSrc:  Axillary Axillary Oral  SpO2:  98% 96% 96%  Weight:   (!) 178.3 kg (393 lb)   Height:        Intake/Output Summary (Last  24 hours) at 01/09/2017 0816 Last data filed at  01/09/2017 0748 Gross per 24 hour  Intake 268 ml  Output 2200 ml  Net -1932 ml   Filed Weights   01/08/17 0315 01/09/17 0333  Weight: (!) 182.3 kg (402 lb) (!) 178.3 kg (393 lb)    Examination:   General: deconditioned Neurology: Awake and alert, non focal  E ENT: mild pallor, no icterus, oral mucosa moist Cardiovascular: wide neck with No JVD. S1-S2 present, rhythmic, no gallops, rubs, or murmurs. No lower extremity edema. Pulmonary: vesicular breath sounds bilaterally, adequate air movement, no wheezing, rhonchi or rales. Decreased breath sound at the dependent zones.  Gastrointestinal. Abdomen protuberant, no organomegaly, non tender, no rebound or guarding Skin. No rashes Musculoskeletal: no joint deformities     Data Reviewed: I have personally reviewed following labs and imaging studies  CBC: Recent Labs  Lab 01/04/17 0740 01/06/17 2113 01/07/17 0335  WBC 8.5 9.2 9.3  NEUTROABS  --  6.6 6.1  HGB 11.5* 11.9* 11.9*  HCT 36.5* 37.0* 37.5*  MCV 88.0 88.3 88.7  PLT 219 234 218   Basic Metabolic Panel: Recent Labs  Lab 01/04/17 0931 01/06/17 2113 01/08/17 0253 01/09/17 0240  NA 144 142 144 144  K 4.6 4.3 4.3 3.5  CL 106 106 106 104  CO2 31 30 30 31   GLUCOSE 170* 223* 128* 147*  BUN 56* 58* 68* 63*  CREATININE 1.45* 1.71* 1.91* 1.54*  CALCIUM 10.2 10.3 9.9 9.6   GFR: Estimated Creatinine Clearance: 79.9 mL/min (A) (by C-G formula based on SCr of 1.54 mg/dL (H)). Liver Function Tests: Recent Labs  Lab 01/06/17 2113  AST 18  ALT 21  ALKPHOS 79  BILITOT 0.6  PROT 7.0  ALBUMIN 2.8*   No results for input(s): LIPASE, AMYLASE in the last 168 hours. No results for input(s): AMMONIA in the last 168 hours. Coagulation Profile: No results for input(s): INR, PROTIME in the last 168 hours. Cardiac Enzymes: No results for input(s): CKTOTAL, CKMB, CKMBINDEX, TROPONINI in the last 168 hours. BNP (last 3  results) No results for input(s): PROBNP in the last 8760 hours. HbA1C: No results for input(s): HGBA1C in the last 72 hours. CBG: Recent Labs  Lab 01/08/17 1607 01/08/17 1952 01/08/17 2331 01/09/17 0332 01/09/17 0747  GLUCAP 131* 135* 148* 143* 154*   Lipid Profile: No results for input(s): CHOL, HDL, LDLCALC, TRIG, CHOLHDL, LDLDIRECT in the last 72 hours. Thyroid Function Tests: Recent Labs    01/06/17 2118  TSH 1.784   Anemia Panel: No results for input(s): VITAMINB12, FOLATE, FERRITIN, TIBC, IRON, RETICCTPCT in the last 72 hours.    Radiology Studies: I have reviewed all of the imaging during this hospital visit personally     Scheduled Meds: . amLODipine  10 mg Per Tube Daily  . aspirin  81 mg Oral Daily  . atorvastatin  20 mg Per Tube QPM  . calcium carbonate  1,250 mg Per Tube Q breakfast  . carvedilol  12.5 mg Per Tube BID WC  . famotidine  20 mg Per Tube BID  . feeding supplement (PRO-STAT SUGAR FREE 64)  60 mL Oral BID  . feeding supplement (VITAL AF 1.2 CAL)  1,000 mL Per Tube Q24H  . free water  200 mL Per Tube Q8H  . furosemide  40 mg Per Tube Daily  . gabapentin  400 mg Per Tube TID  . heparin  5,000 Units Subcutaneous Q8H  . hydrALAZINE  20 mg Per Tube Q8H  . insulin aspart  0-9 Units Subcutaneous Q4H  .  insulin glargine  28 Units Subcutaneous QHS  . ondansetron  4 mg Intravenous Once  . risperiDONE  1 mg Oral BID  . sodium chloride flush  3 mL Intravenous Q12H   Continuous Infusions: . sodium chloride       LOS: 2 days        Tylan Briguglio Annett Gulaaniel Dannae Kato, MD [facility] Pager [phone]

## 2017-01-09 NOTE — Progress Notes (Signed)
Repositioned patient in the bed. Patient tolerated well. Wife is at bedside.

## 2017-01-10 LAB — BASIC METABOLIC PANEL
ANION GAP: 6 (ref 5–15)
BUN: 60 mg/dL — AB (ref 6–20)
CO2: 31 mmol/L (ref 22–32)
Calcium: 9.5 mg/dL (ref 8.9–10.3)
Chloride: 103 mmol/L (ref 101–111)
Creatinine, Ser: 1.43 mg/dL — ABNORMAL HIGH (ref 0.61–1.24)
GFR, EST AFRICAN AMERICAN: 59 mL/min — AB (ref >60)
GFR, EST NON AFRICAN AMERICAN: 51 mL/min — AB (ref >60)
Glucose, Bld: 170 mg/dL — ABNORMAL HIGH (ref 65–99)
POTASSIUM: 3.2 mmol/L — AB (ref 3.5–5.1)
SODIUM: 140 mmol/L (ref 135–145)

## 2017-01-10 LAB — GLUCOSE, CAPILLARY
GLUCOSE-CAPILLARY: 157 mg/dL — AB (ref 65–99)
GLUCOSE-CAPILLARY: 166 mg/dL — AB (ref 65–99)
GLUCOSE-CAPILLARY: 180 mg/dL — AB (ref 65–99)
GLUCOSE-CAPILLARY: 213 mg/dL — AB (ref 65–99)
Glucose-Capillary: 168 mg/dL — ABNORMAL HIGH (ref 65–99)
Glucose-Capillary: 158 mg/dL — ABNORMAL HIGH (ref 65–99)

## 2017-01-10 MED ORDER — OXYCODONE HCL 5 MG PO TABS: 5.0000 mg | ORAL_TABLET | Freq: Four times a day (QID) | ORAL | 0 refills | Status: AC | PRN

## 2017-01-10 NOTE — Procedures (Signed)
Objective Swallowing Evaluation: Type of Study: FEES-Fiberoptic Endoscopic Evaluation of Swallow   Patient Details  Name: Jordan Villanueva MRN: [id number] Date of Birth: 05/02/1955  Today's Date: 01/10/2017 Time: SLP Start Time (ACUTE ONLY): 1021 -SLP Stop Time (ACUTE ONLY): 1041  SLP Time Calculation (min) (ACUTE ONLY): 20 min   Past Medical History: History reviewed. No pertinent past medical history. Past Surgical History:  Past Surgical History:  Procedure Laterality Date  . IR GASTROSTOMY TUBE MOD SED  12/20/2016   HPI: 61 year old male who presented with edema. 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 He is s/p C3-4 anterior cervical diskectomy and fusion. Apparently after his spinal cord injury patientwasdischarged to a acute long-term care facility, andthen to a nursing home.Over the last few days he was noted to have generalized, severe progressive edema,complicated by lethargy. [facility] care everywhere [facility] revealing penetration with thin, nectar and puree. Full report not available but exam limited due to overlying anatomy restricting view of pt's upper airway. Per notes pt work with SLP, planning to repeat FEES 12/16/16 however no report found; appears he was d/c prior to completing.   Subjective: "Please bring me some ice and water"    Assessment / Plan / Recommendation  CHL IP CLINICAL IMPRESSIONS 01/10/2017  Clinical Impression Oropharyngeal swallow was functional overall, without observed penetration or aspiration. Min-mild pyriform sinus residue noted with nectar and mild vallecular following solid. He is at increased risk given deconditioning for past several months, standing (mild) standing secretions at glottis and potential poor positioning on air matress and limited HOB option. Recommend conservative texture of Dys 2,  thin liquids, straws allowed, crush pills, place pillows behind back prior to po's. ST will follow.   SLP Visit Diagnosis Dysphagia, pharyngeal phase (R13.13)  Attention and concentration deficit following --  Frontal lobe and executive function deficit following --  Impact on safety and function (No Data)      CHL IP TREATMENT RECOMMENDATION 01/10/2017  Treatment Recommendations Therapy as outlined in treatment plan below     Prognosis 01/10/2017  Prognosis for Safe Diet Advancement Good  Barriers to Reach Goals --  Barriers/Prognosis Comment --    CHL IP DIET RECOMMENDATION 01/10/2017  SLP Diet Recommendations Dysphagia 2 (Fine chop) solids;Thin liquid  Liquid Administration via Cup;Straw  Medication Administration Crushed with puree  Compensations Slow rate;Small sips/bites  Postural Changes Seated upright at 90 degrees;Remain semi-upright after after feeds/meals (Comment)      CHL IP OTHER RECOMMENDATIONS 01/10/2017  Recommended Consults --  Oral Care Recommendations Oral care BID  Other Recommendations --      CHL IP FOLLOW UP RECOMMENDATIONS 01/10/2017  Follow up Recommendations (No Data)      CHL IP FREQUENCY AND DURATION 01/10/2017  Speech Therapy Frequency (ACUTE ONLY) min 2x/week  Treatment Duration 2 weeks           CHL IP ORAL PHASE 01/10/2017  Oral Phase WFL  Oral - Pudding Teaspoon --  Oral - Pudding Cup --  Oral - Honey Teaspoon --  Oral - Honey Cup --  Oral - Nectar Teaspoon --  Oral - Nectar Cup --  Oral - Nectar Straw --  Oral - Thin Teaspoon --  Oral - Thin Cup --  Oral - Thin Straw --  Oral - Puree --  Oral - Mech Soft --  Oral - Regular --  Oral -  Multi-Consistency --  Oral - Pill --  Oral Phase - Comment --    CHL IP PHARYNGEAL PHASE 01/10/2017  Pharyngeal Phase Impaired  Pharyngeal- Pudding Teaspoon --  Pharyngeal --  Pharyngeal- Pudding Cup --  Pharyngeal --  Pharyngeal- Honey Teaspoon --  Pharyngeal --  Pharyngeal- Honey Cup  --  Pharyngeal --  Pharyngeal- Nectar Teaspoon WFL  Pharyngeal --  Pharyngeal- Nectar Cup Pharyngeal residue - pyriform  Pharyngeal --  Pharyngeal- Nectar Straw --  Pharyngeal --  Pharyngeal- Thin Teaspoon WFL  Pharyngeal --  Pharyngeal- Thin Cup WFL  Pharyngeal --  Pharyngeal- Thin Straw WFL  Pharyngeal --  Pharyngeal- Puree --  Pharyngeal --  Pharyngeal- Mechanical Soft --  Pharyngeal --  Pharyngeal- Regular Pharyngeal residue - valleculae  Pharyngeal --  Pharyngeal- Multi-consistency --  Pharyngeal --  Pharyngeal- Pill --  Pharyngeal --  Pharyngeal Comment --     CHL IP CERVICAL ESOPHAGEAL PHASE 01/10/2017  Cervical Esophageal Phase WFL  Pudding Teaspoon --  Pudding Cup --  Honey Teaspoon --  Honey Cup --  Nectar Teaspoon --  Nectar Cup --  Nectar Straw --  Thin Teaspoon --  Thin Cup --  Thin Straw --  Puree --  Mechanical Soft --  Regular --  Multi-consistency --  Pill --  Cervical Esophageal Comment --    No flowsheet data found.  Royce MacadamiaLitaker, Rainee Sweatt Willis 01/10/2017, 11:35 AM  Breck CoonsLisa Willis Lonell FaceLitaker M.Ed [employer]-SLP Pager [phone]

## 2017-01-10 NOTE — Clinical Social Work Note (Signed)
Clinical Social Work Assessment  Patient Details  Name: Jordan Villanueva MRN: [id number] Date of Birth: 09/24/1955  Date of referral:  01/10/17               Reason for consult:  Facility Placement, Discharge Planning                Permission sought to share information with:  Facility Medical sales representativeContact Representative, Family Supports Permission granted to share information::  Yes, Verbal Permission Granted  Name::     Sherrlyn HockDebra Dimichele  Agency::  SNF's  Relationship::  Wife  Contact Information:  [phone]  Housing/Transportation Living arrangements for the past 2 months:  Skilled Nursing Facility([facility]) Source of Information:  Medical Team, Spouse Patient Interpreter Needed:  None Criminal Activity/Legal Involvement Pertinent to Current Situation/Hospitalization:  No - Comment as needed Significant Relationships:  Adult Children, Spouse Lives with:  Facility Resident Do you feel safe going back to the place where you live?  Yes Need for family participation in patient care:  Yes (Comment)  Care giving concerns:  Patient [facility]. Wife does not want him to return there.   Social Worker assessment / plan:  Patient not fully oriented. No supports at bedside. CSW called patient's wife, introduced role, and explained that discharge planning would be discussed. Patient's wife confirmed that [facility] to [facility] but does not want him to return. [place]. Patient's wife discussed care concerns at SNF. No further concerns. CSW encouraged patient's wife to contact CSW as needed. CSW will continue to follow patient and his wife for support and facilitate discharge to SNF once medically stable.  Employment status:  Unemployed Health and safety inspectornsurance information:  Medicare PT Recommendations:  Not assessed at this time Information / Referral to community resources:  Skilled Nursing Facility  Patient/Family's Response to  care:  Patient not fully oriented. Patient's wife wants him discharged to another SNF. Patient's wife and daughter supportive and involved in patient's care. Patient's wife appreciated social work intervention.  Patient/Family's Understanding of and Emotional Response to Diagnosis, Current Treatment, and Prognosis:  Patient not fully oriented. Patient's wife has a good understanding of the reason for admission and his need for continued rehab. Discussed what led to quadriplegia. Patient's wife appears happy with hospital care.  Emotional Assessment Appearance:  Appears stated age Attitude/Demeanor/Rapport:  Unable to Assess Affect (typically observed):  Unable to Assess Orientation:  Oriented to Self, Oriented to Place Alcohol / Substance use:  Never Used Psych involvement (Current and /or in the community):  No (Comment)  Discharge Needs  Concerns to be addressed:  Care Coordination Readmission within the last 30 days:  No Current discharge risk:  Cognitively Impaired, Dependent with Mobility Barriers to Discharge:  Other(Weight limits at SNF)   Margarito LinerSarah C Jakita Dutkiewicz, LCSW 01/10/2017, 11:08 AM

## 2017-01-10 NOTE — Discharge Summary (Signed)
Physician Discharge Summary  Cypress Fanfan WUJ:[id number] DOB: 1955-08-27 DOA: 01/06/2017  PCP: System, Provider Not In  Admit date: 01/06/2017 Discharge date: 01/10/2017  Admitted From: SNF Disposition:  SNF  Recommendations for Outpatient Follow-up:  1. Follow up with PCP in 1- week 2. Patient will resume 40 mg of furosemide to keep negative fluid balance.  Home Health: No  Equipment/Devices: No   Discharge Condition: Stable CODE STATUS: Full  Diet recommendation: Heart healthy, sodium restricted (2 grams per day), diabetic prudent.  Brief/Interim Summary: 61 year old male who presented with edema. 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 Apparently after his spinal cord injury patientwasdischarge to a acute long-term care facility, andthen to a nursing home.Over the last few days he was noted to have generalized, severe progressive edema,complicated by lethargy. Most of the history on admission was obtained from his wife at the bedside,patient unable to give detailed history.On the initial physical examination blood pressure 139/73, heart rate 88, oxygen saturation 87%.Dry mucous membranes, heart S1-S2 present rhythmic, lungs had decreased breath sounds bilaterally,positive rales,abdomen protuberant, no masses, nontender, PEG tube in place, lower extremity with 2+ pitting edema.Sodium 142, potassium 4.3, chloride 106, bicarbonate 30, glucose 223, BUN 58, creatinine 1.71,white count 9.2,  Hb 11.9, hematocrit 37.0, platelets 234,urine analysis negative for infection,arterial blood gas 7.43/49.7/80/35/97%.Chest x-ray with left rotation, poor inflation,bilateral interstitial infiltrates,small left pleural effusion,cardiomegaly.EKG 80 bpm, normal axis, normal intervals, poor R-wave progression.  Patient was admitted to hospital with working  diagnosis of acute hypoxic respiratory failure due to cardiogenic pulmonary edema,due to decompensated acute on chronic diastolic heart failure.  1. Acute and chronic diastolic heart failure. Patient was admitted to the telemetry unit, he was aggressively diuresed with IV furosemide, negative fluid balance was achieved, (-3,701 ml), with significant improvement of his symptoms. Further workup with echocardiography showed ejection fraction of the left ventricle 60-65%, increased wall thickness, consistent with moderate LVH, limited images. Patient will be discharged on carvedilol, and to consider start ACE inhibitor as an outpatient, depending on kidney function.  2. Acute on chronic hypoxic respiratory failure due to cardiogenic pulmonary edema. Patient was placed on supplemental oxygen per nasal cannula, responded well to diuresis, follow-up chest film show significant improvement of interstitial infiltrates. Discharge oximetry 96% on 3 L per nasal cannula of supplemental oxygen.   3. Acute metabolic encephalopathy. Likely multifactorial, improved during his hospitalization, at discharge he is presumed to be at his baseline.   4. Acute kidney injury on chronic kidney disease stage III. Patient developed acute kidney injury with a peak creatinine 1.9, his discharge creatinine is 1.43, potassium 3.2, serum bicarbonate 31. Will recommend to continue furosemide, with a close follow-up of kidney function and electrolytes.   5. Type 2 diabetes mellitus. Patient was continued on insulin regimen including insulin sliding scale and basal long-acting insulin, his capillary glucose remained well-controlled and he did tolerate well to feeds.  6. Hypertension. Blood pressure remained well-controlled with amlodipine and hydralazine.  7. Quadriplegia with swallow dysfunction. Patient was evaluated with speech therapy, recommendation is to keep patient nothing by mouth, she received DVT prophylaxis and he was  continued on risperidone and trazodone.    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   aspirin EC 81 MG tablet Give 1 tablet daily via G-Tube   atorvastatin 20 MG tablet Commonly known as:  LIPITOR Place 20 mg every evening into feeding tube.   calcium carbonate 1250 MG capsule Place 1,250 mg daily into feeding tube.   carvedilol 12.5 MG tablet Commonly known as:  COREG Place 12.5 mg 2 (two) times daily with a meal into feeding tube.   cholecalciferol 1000 units tablet Commonly known as:  VITAMIN D Give 1 tablet via G-tube daily   cyanocobalamin 1000 MCG tablet Place 1,000 mcg daily into feeding tube.   famotidine 20 MG tablet Commonly known as:  PEPCID Place 20 mg 2 (two) times daily into feeding tube.   furosemide 40 MG tablet Commonly known as:  LASIX Place 40 mg daily into feeding tube.   gabapentin 400 MG capsule Commonly known as:  NEURONTIN Place 400 mg 3 (three) times daily into feeding tube.   heparin 5000 UNIT/ML injection Inject 5,000 Units every 8 (eight) hours into the skin.   HUMALOG 100 UNIT/ML injection Generic drug:  insulin lispro Inject as per sliding scale subcutaneously every 6 hours 200 - 250 = 2 units 251 - 300 = 4 units 301 - 400 = 6 units Greater than 400 call MD   hydrALAZINE 10 MG tablet Commonly known as:  APRESOLINE Place 20 mg 3 (three) times daily into feeding tube.   LANTUS 100 UNIT/ML injection Generic drug:  insulin glargine Inject 28 Units at bedtime into the skin.    multivitamin tablet Place 1 tablet daily into feeding tube.   NUTRITIONAL DRINK Liqd Administer Sugar Free Med pass per GT via Pump.  Rate 5m/hr for 24 hours a day   oxyCODONE 5 MG immediate release tablet Commonly known as:  ROXICODONE Place 1 tablet (5 mg total) every 6 (six) hours as needed into feeding tube for severe pain.   polyethylene glycol packet Commonly known as:  MIRALAX / GLYCOLAX Place 17 g daily into feeding tube.   risperiDONE 1 MG tablet Commonly known as:  RISPERDAL Place 1 mg 2 (two) times daily into feeding tube.   senna 8.6 MG tablet Commonly known as:  SENOKOT Place 2 tablets daily into feeding tube.   UNABLE TO FIND Enternal feed order every 12 hours Bolus with 50 ml of water every 2 hours for hydration and tube patency.  Flush every shift with 30-60 ml water before and after meds, before initiating feedings or when there is an interruption of feeding to maintain tube patency.  Flush every shift with 5-172mwater between each medication       No Known Allergies  Consultations:     Procedures/Studies: Ct Abdomen Wo Contrast  Result Date: 12/17/2016 CLINICAL DATA:  Evaluate anatomy for potential percutaneous gastrostomy tube placement. EXAM: CT ABDOMEN WITHOUT CONTRAST TECHNIQUE: Multidetector CT imaging of the abdomen was performed following the standard protocol without IV contrast. COMPARISON:  CT abdomen pelvis - 01/07/2012 FINDINGS: The lack of intravenous contrast limits the ability to evaluate solid abdominal organs. Examination is further degraded secondary to patient body habitus and quantum mottle artifact. Lower chest: Minimal dependent subpleural atelectasis. No discrete focal airspace opacities.  Borderline cardiomegaly.  No pericardial effusion. Hepatobiliary: Normal hepatic contour. Post cholecystectomy. No ascites. Pancreas: Normal appearance of the pancreas Spleen: Normal appearance of the spleen. Note is made of a small splenule.  Adrenals/Urinary Tract: Normal noncontrast appearance the bilateral kidneys. No renal stones. No urinary obstruction. Note is made of an approximately 3.4 x 3.0 cm hypoattenuating (-46 Hounsfield unit) left-sided adrenal myelolipoma, similar to the 12/2011 examination. Normal noncontrast appearance of the right adrenal gland. Stomach/Bowel: The anterior aspect of the mid body of the stomach is well apposed against the ventral wall of the abdomen without interposed liver or colon. Enteric tube tip terminates regional to the [place]. Apparent left lateral abdominal hernia containing portion of the descending colon (image 66, series 3), incompletely imaged. Nonobstructive bowel gas pattern. No pneumoperitoneum, pneumatosis or portal venous gas. Vascular/Lymphatic: Minimal amount of atherosclerotic plaque within a normal caliber abdominal aorta. No bulky retroperitoneal, mesenteric, pelvic or inguinal lymphadenopathy. Other: Left lateral abdominal wall hernia, incompletely evaluated. Musculoskeletal: Severe DDD of L-S1 with disc space height loss, endplate irregularity and sclerosis. 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 IMPRESSION: 1. Gastric anatomy amenable to attempted percutaneous gastrostomy tube placement as clinically indicated. 2. Suspected left lateral abdominal wall hernia containing a portion of the descending colon, incompletely imaged. No definite evidence of enteric obstruction. 3. Benign left adrenal myelolipoma, grossly unchanged compared to the 12/2011 examination. 4.  Aortic Atherosclerosis (ICD10-I70.0). Electronically Signed   By: Sandi Mariscal M.D.   On: 12/17/2016 07:47   Dg Chest 2 View  Result Date: 01/06/2017 CLINICAL DATA:  Shortness of breath. EXAM: CHEST  2 VIEW COMPARISON:  Chest radiograph 12/14/2016. FINDINGS: Patient is rotated to the left. Anterior cervical spinal fusion hardware. Marked cardiomegaly. Monitoring leads overlie the patient. Pulmonary vascular  redistribution. Bilateral hazy pulmonary opacities. Small left pleural effusion. IMPRESSION: Findings suggestive of mild interstitial edema and possible left pleural effusion. Cardiomegaly. Limited exam. Electronically Signed   By: Lovey Newcomer M.D.   On: 01/06/2017 21:55   Ir Gastrostomy Tube Mod Sed  Result Date: 12/20/2016 CLINICAL DATA:  Respiratory failure and need for gastrostomy tube for nutrition. EXAM: PERCUTANEOUS GASTROSTOMY TUBE PLACEMENT ANESTHESIA/SEDATION: 0.5 mg IV Versed; 25 mcg IV Fentanyl. Total Moderate Sedation Time 9.0 minutes. The patient's level of consciousness and physiologic status were continuously monitored during the procedure by Radiology nursing. CONTRAST:  15 mL Isovue-300 Contrast was injected directly into the lumen of the stomach. MEDICATIONS: 2 g IV Ancef. IV antibiotic was administered in an appropriate time interval prior to needle puncture of the skin. FLUOROSCOPY TIME:  1 minutes and 48 seconds.  68.8 mGy. PROCEDURE: The procedure, risks, benefits, and alternatives were explained to the patient. Questions regarding the procedure were encouraged and answered. The patient understands and consents to the procedure. A time-out was performed prior to initiating the procedure. A 5-French catheter was then advanced through the the patient's mouth under fluoroscopy into the esophagus and to the level of the stomach. This catheter was used to insufflate the stomach with air under fluoroscopy. The abdominal wall was prepped with chlorhexidine in a sterile fashion, and a sterile drape was applied covering the operative field. A sterile gown and sterile gloves were used for the procedure. Local anesthesia was provided with 1% Lidocaine. A skin incision was made in the upper abdominal wall. Under fluoroscopy, an 18 gauge trocar needle was advanced into the stomach. Contrast injection was performed to confirm intraluminal position of the needle tip. A single  T tack was then deployed in  the lumen of the stomach. This was brought up to tension at the skin surface. Over a guidewire, a 9-French sheath was advanced into the lumen of the stomach. The wire was left in place as a safety wire. A loop snare device from a percutaneous gastrostomy kit was then advanced into the stomach. A floppy guide wire was advanced through the orogastric catheter under fluoroscopy in the stomach. The loop snare advanced through the percutaneous gastric access was used to snare the guide wire. This allowed withdrawal of the loop snare out of the patient's mouth by retraction of the orogastric catheter and wire. A 20-French bumper retention gastrostomy tube was looped around the snare device. It was then pulled back through the patient's mouth. The retention bumper was brought up to the anterior gastric wall. The T tack suture was cut at the skin. The exiting gastrostomy tube was cut to appropriate length and a feeding adapter applied. The catheter was injected with contrast material to confirm position and a fluoroscopic spot image saved. The tube was then flushed with saline. A dressing was applied over the gastrostomy exit site. COMPLICATIONS: None. FINDINGS: The stomach distended well with air allowing safe placement of the gastrostomy tube. After placement, the tip of the gastrostomy tube lies in the body of the stomach. IMPRESSION: Percutaneous gastrostomy with placement of a 20-French bumper retention tube in the body of the stomach. This tube can be used for percutaneous feeds beginning in 24 hours after placement. Electronically Signed   By: Aletta Edouard M.D.   On: 12/20/2016 11:26   Dg Chest Port 1 View  Result Date: 01/09/2017 CLINICAL DATA:  Dyspnea. EXAM: PORTABLE CHEST 1 VIEW COMPARISON:  01/06/2017 FINDINGS: Persistently enlarged cardiac silhouette. Mediastinal contours appear intact. Interstitial pulmonary edema. Streaky opacities in bilateral lung bases may represent atelectasis versus airspace  consolidation. Likely bilateral small pleural effusions. Low lung volume. Osseous structures are without acute abnormality. Soft tissues are grossly normal. IMPRESSION: Enlarged heart. Interstitial pulmonary edema with small bilateral pleural effusions. Streaky airspace opacities in bilateral lung bases may represent atelectasis versus airspace consolidation. Electronically Signed   By: Fidela Salisbury M.D.   On: 01/09/2017 07:11   Dg Chest Port 1 View  Result Date: 12/14/2016 CLINICAL DATA:  Respiratory failure EXAM: PORTABLE CHEST 1 VIEW COMPARISON:  None. FINDINGS: Feeding tube extends into the stomach. Cardiomegaly noted. Low lung volumes. No infiltrate or pneumothorax. Mild venous congestion IMPRESSION: Cardiomegaly and mild venous congestion. Electronically Signed   By: Suzy Bouchard M.D.   On: 12/14/2016 20:38   Dg Abd Portable 1v  Result Date: 12/17/2016 CLINICAL DATA:  NG tube placement EXAM: PORTABLE ABDOMEN - 1 VIEW COMPARISON:  None. FINDINGS: The tip and side port of a gastric tube are seen in the expected location of the gastric fundus and body. Scattered gas containing small and large bowel loops with moderate amount of stool in the right colon. No free air. No radio-opaque calculi or other significant radiographic abnormality are seen. IMPRESSION: The tip and side port of a gastric tube are seen within the expected location of the gastric fundus and proximal body. Electronically Signed   By: Ashley Royalty M.D.   On: 12/17/2016 21:08   Dg Abd Portable 1v  Result Date: 12/17/2016 CLINICAL DATA:  Evaluate enteric tube placement. EXAM: PORTABLE ABDOMEN - 1 VIEW COMPARISON:  12/14/2016 FINDINGS: The enteric tube both is identified within the left upper quadrant of the abdomen in the  expected location of the stomach. This appears folded aupon itself. Bowel gas pattern is unremarkable. IMPRESSION: 1. The enteric tube is in the stomach and appears folded upon itself. Electronically Signed    By: Kerby Moors M.D.   On: 12/17/2016 15:39   Dg Abd Portable 1v  Result Date: 12/14/2016 CLINICAL DATA:  Feeding tube placement EXAM: PORTABLE ABDOMEN - 1 VIEW COMPARISON:  None. FINDINGS: Feeding tube tip is in the fourth portion of the duodenum. There is moderate stool in the colon. There is no bowel dilatation or air-fluid level to suggest bowel obstruction. No evident free air. IMPRESSION: Feeding tube tip at level of fourth portion of duodenum. Bowel gas pattern unremarkable. Electronically Signed   By: Lowella Grip III M.D.   On: 12/14/2016 20:39       Subjective: Patient feeling better, dyspnea has improved, no chest pain, no nausea or vomiting, tolerating well tube feedings.   Discharge Exam: Vitals:   01/10/17 0036 01/10/17 0424  BP: 132/66 121/70  Pulse: 74 77  Resp: 20   Temp: 99.7 F (37.6 C) 98.6 F (37 C)  SpO2: 94% 96%   Vitals:   01/09/17 2002 01/10/17 0036 01/10/17 0424 01/10/17 0438  BP: 131/63 132/66 121/70   Pulse: 71 74 77   Resp: 20 20    Temp: 98.8 F (37.1 C) 99.7 F (37.6 C) 98.6 F (37 C)   TempSrc: Oral Axillary Oral   SpO2: 98% 94% 96%   Weight:    (!) 183.3 kg (404 lb)  Height:        General: Pt is alert, awake, not in acute distress Cardiovascular: RRR, S1/S2 +, no rubs, no gallops, wide neck but no JVD Respiratory: CTA bilaterally, no wheezing, no rhonchi, decreased breath sounds at the dependent zones.  Abdominal: Soft, NT, ND, bowel sounds + Extremities: mild non pitting edema at the lower and upper extremities, no cyanosis    The results of significant diagnostics from this hospitalization (including imaging, microbiology, ancillary and laboratory) are listed below for reference.     Microbiology: Recent Results (from the past 240 hour(s))  MRSA PCR Screening     Status: None   Collection Time: 01/07/17  5:26 PM  Result Value Ref Range Status   MRSA by PCR NEGATIVE NEGATIVE Final    Comment:        The GeneXpert  MRSA Assay (FDA approved for NASAL specimens only), is one component of a comprehensive MRSA colonization surveillance program. It is not intended to diagnose MRSA infection nor to guide or monitor treatment for MRSA infections.      Labs: BNP (last 3 results) Recent Labs    01/06/17 2113  BNP 16.1   Basic Metabolic Panel: Recent Labs  Lab 01/04/17 0931 01/06/17 2113 01/08/17 0253 01/09/17 0240 01/10/17 0426  NA 144 142 144 144 140  K 4.6 4.3 4.3 3.5 3.2*  CL 106 106 106 104 103  CO2 '31 30 30 31 31  '$ GLUCOSE 170* 223* 128* 147* 170*  BUN 56* 58* 68* 63* 60*  CREATININE 1.45* 1.71* 1.91* 1.54* 1.43*  CALCIUM 10.2 10.3 9.9 9.6 9.5   Liver Function Tests: Recent Labs  Lab 01/06/17 2113  AST 18  ALT 21  ALKPHOS 79  BILITOT 0.6  PROT 7.0  ALBUMIN 2.8*   No results for input(s): LIPASE, AMYLASE in the last 168 hours. No results for input(s): AMMONIA in the last 168 hours. CBC: Recent Labs  Lab 01/04/17 0740 01/06/17 2113  01/07/17 0335  WBC 8.5 9.2 9.3  NEUTROABS  --  6.6 6.1  HGB 11.5* 11.9* 11.9*  HCT 36.5* 37.0* 37.5*  MCV 88.0 88.3 88.7  PLT 219 234 218   Cardiac Enzymes: No results for input(s): CKTOTAL, CKMB, CKMBINDEX, TROPONINI in the last 168 hours. BNP: Invalid input(s): POCBNP CBG: Recent Labs  Lab 01/09/17 1632 01/09/17 2006 01/10/17 0040 01/10/17 0428 01/10/17 0759  GLUCAP 152* 161* 180* 157* 166*   D-Dimer No results for input(s): DDIMER in the last 72 hours. Hgb A1c No results for input(s): HGBA1C in the last 72 hours. Lipid Profile No results for input(s): CHOL, HDL, LDLCALC, TRIG, CHOLHDL, LDLDIRECT in the last 72 hours. Thyroid function studies No results for input(s): TSH, T4TOTAL, T3FREE, THYROIDAB in the last 72 hours.  Invalid input(s): FREET3 Anemia work up No results for input(s): VITAMINB12, FOLATE, FERRITIN, TIBC, IRON, RETICCTPCT in the last 72 hours. Urinalysis    Component Value Date/Time   COLORURINE  YELLOW 01/07/2017 0207   APPEARANCEUR CLEAR 01/07/2017 0207   LABSPEC 1.016 01/07/2017 0207   PHURINE 5.0 01/07/2017 0207   GLUCOSEU 50 (A) 01/07/2017 0207   HGBUR NEGATIVE 01/07/2017 0207   BILIRUBINUR NEGATIVE 01/07/2017 0207   KETONESUR NEGATIVE 01/07/2017 0207   PROTEINUR 100 (A) 01/07/2017 0207   NITRITE NEGATIVE 01/07/2017 0207   LEUKOCYTESUR NEGATIVE 01/07/2017 0207   Sepsis Labs Invalid input(s): PROCALCITONIN,  WBC,  LACTICIDVEN Microbiology Recent Results (from the past 240 hour(s))  MRSA PCR Screening     Status: None   Collection Time: 01/07/17  5:26 PM  Result Value Ref Range Status   MRSA by PCR NEGATIVE NEGATIVE Final    Comment:        The GeneXpert MRSA Assay (FDA approved for NASAL specimens only), is one component of a comprehensive MRSA colonization surveillance program. It is not intended to diagnose MRSA infection nor to guide or monitor treatment for MRSA infections.      Time coordinating discharge: 45 minutes  SIGNED:   Tawni Millers, MD  [facility] 01/10/2017, 9:43 AM Pager [phone]  If 7PM-7AM, please contact night-coverage [email or web address] Password [email or web address]

## 2017-01-10 NOTE — [place] FL2 (Signed)
[place] MEDICAID FL2 LEVEL OF CARE SCREENING TOOL     IDENTIFICATION  Patient Name: Jordan Villanueva Birthdate: 11/17/1955 Sex: male Admission Date (Current Location): 01/06/2017  [facility] [place]  [employer]   Facility and Address:  [facility], [address], [place], [place] [address]      Provider Number: [id number]  Attending Physician Name and Address:  Coralie KeensArrien, Mauricio Daniel,*  Relative Name and Phone Number:       Current Level of Care: Hospital Recommended Level of Care: Skilled Nursing Facility Prior Approval Number:    Date Approved/Denied:   PASRR Number: [id number]  Discharge Plan: SNF    Current Diagnoses: Patient Active Problem List   Diagnosis Date Noted  . CHF exacerbation (HCC) 01/07/2017  . Anasarca 01/07/2017  . Protein malnutrition (HCC) 01/07/2017  . Quadriplegia (HCC) 01/07/2017  . Diabetes mellitus type 2 in obese (HCC) 01/07/2017  . 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 Respiratory failure (HCC) 01/07/2017    Orientation RESPIRATION BLADDER Height & Weight     Self, Place  O2(Nasal Canula 3 L. Cpap at night: Respiratory rate (16), Epap (10), Oxygen (36%), Flow rate (4). Wife said he doesn't have a machine at home anymore.) Continent, Indwelling catheter Weight: (!) 404 lb (183.3 kg) Height:  5\' 10"  (177.8 cm)  BEHAVIORAL SYMPTOMS/MOOD NEUROLOGICAL BOWEL NUTRITION STATUS  Other (Comment)(Anxious, Cooperative.) (None) Continent Feeding tube(Peg)  AMBULATORY STATUS COMMUNICATION OF NEEDS Skin   Total Care Verbally Bruising, Other (Comment)(Blister, catheter entry/exit, MASD, Skin tear.)                       Personal Care Assistance Level of Assistance  Total care       Total Care Assistance: Maximum assistance   Functional Limitations Info  Sight, Hearing, Speech Sight Info: Adequate Hearing Info: Adequate Speech Info: Adequate    SPECIAL CARE FACTORS FREQUENCY   PT (By licensed PT), OT (By licensed OT)     PT Frequency: 5 x week OT Frequency: 5 x week            Contractures Contractures Info: Not present    Additional Factors Info  Code Status, Allergies Code Status Info: Full Allergies Info: NKDA           Current Medications (01/10/2017):  This is the current hospital active medication list Current Facility-Administered Medications  Medication Dose Route Frequency Provider Last Rate Last Dose  . 0.9 %  sodium chloride infusion  250 mL Intravenous PRN Madelyn FlavorsSmith, Rondell A, MD      . acetaminophen (TYLENOL) tablet 650 mg  650 mg Oral Q4H PRN Smith, Rondell A, MD      . amLODipine (NORVASC) tablet 10 mg  10 mg Per Tube Daily Madelyn FlavorsSmith, Rondell A, MD   10 mg at 01/09/17 0904  . aspirin chewable tablet 81 mg  81 mg Oral Daily Madelyn FlavorsSmith, Rondell A, MD   81 mg at 01/09/17 0904  . atorvastatin (LIPITOR) tablet 20 mg  20 mg Per Tube QPM Katrinka BlazingSmith, Rondell A, MD   20 mg at 01/09/17 1634  . calcium carbonate (OS-CAL - dosed in mg of elemental calcium) tablet 1,250 mg  1,250 mg Per Tube Q breakfast Katrinka BlazingSmith, Rondell A, MD   1,250 mg at 01/10/17 0800  . carvedilol (COREG) tablet 12.5 mg  12.5 mg Per Tube BID WC Smith, Rondell A, MD   12.5 mg at 01/09/17 1634  .  famotidine (PEPCID) tablet 20 mg  20 mg Per Tube BID Madelyn FlavorsSmith, Rondell A, MD   20 mg at 01/09/17 2204  . feeding supplement (PRO-STAT SUGAR FREE 64) liquid 60 mL  60 mL Oral BID Coralie KeensArrien, Mauricio Daniel, MD   60 mL at 01/09/17 2205  . feeding supplement (VITAL AF 1.2 CAL) liquid 1,000 mL  1,000 mL Per Tube Q24H Coralie KeensArrien, Mauricio Daniel, MD 40 mL/hr at 01/09/17 1628 1,000 mL at 01/09/17 1628  . free water 200 mL  200 mL Per Tube Q8H Arrien, York RamMauricio Daniel, MD   200 mL at 01/10/17 0535  . furosemide (LASIX) tablet 40 mg  40 mg Per Tube Daily Arrien, York RamMauricio Daniel, MD   40 mg at 01/09/17 1000  . gabapentin (NEURONTIN) capsule 400 mg  400 mg Per Tube TID Madelyn FlavorsSmith, Rondell A, MD   400 mg at 01/09/17 2204  . heparin  injection 5,000 Units  5,000 Units Subcutaneous Q8H Smith, Rondell A, MD   5,000 Units at 01/10/17 0535  . hydrALAZINE (APRESOLINE) tablet 20 mg  20 mg Per Tube Q8H Smith, Rondell A, MD   20 mg at 01/10/17 0534  . insulin aspart (novoLOG) injection 0-9 Units  0-9 Units Subcutaneous Q4H Clydie BraunSmith, Rondell A, MD   2 Units at 01/10/17 0429  . insulin glargine (LANTUS) injection 28 Units  28 Units Subcutaneous QHS Clydie BraunSmith, Rondell A, MD   28 Units at 01/09/17 2214  . ipratropium-albuterol (DUONEB) 0.5-2.5 (3) MG/3ML nebulizer solution 3 mL  3 mL Nebulization Q4H PRN Katrinka BlazingSmith, Rondell A, MD      . ondansetron (ZOFRAN) injection 4 mg  4 mg Intravenous Once Renne CriglerGeiple, Joshua, PA-C      . ondansetron (ZOFRAN) injection 4 mg  4 mg Intravenous Q6H PRN Smith, Rondell A, MD      . oxyCODONE (Oxy IR/ROXICODONE) immediate release tablet 5 mg  5 mg Per Tube Q6H PRN Madelyn FlavorsSmith, Rondell A, MD   5 mg at 01/10/17 0030  . risperiDONE (RISPERDAL) tablet 1 mg  1 mg Oral BID Arrien, York RamMauricio Daniel, MD   1 mg at 01/09/17 2205  . sodium chloride flush (NS) 0.9 % injection 3 mL  3 mL Intravenous Q12H Smith, Rondell A, MD   3 mL at 01/09/17 2218  . sodium chloride flush (NS) 0.9 % injection 3 mL  3 mL Intravenous PRN Clydie BraunSmith, Rondell A, MD         Discharge Medications: Please see discharge summary for a list of discharge medications.  Relevant Imaging Results:  Relevant Lab Results:   Additional Information SS#: [id number]. [facility] before being admitted to the hospital. Wife says he does have Medicaid.  Margarito LinerSarah C Buddie Marston, LCSW

## 2017-01-10 NOTE — Clinical Social Work Note (Addendum)
Patient has no bed offers. CSW expanded search.  Charlynn CourtSarah Daisy Mcneel, CSW [phone]  2:15 pm CSW left voicemail for hospital liaison for [facility] in [place]. [facility] in [place] does not have a bed today. They also have to check their weight limit.  Charlynn CourtSarah Mirian Casco, CSW [phone]  2:45 pm [facility] cannot take patient due to a combination of weight and the fact that he is total assist. CSW received call from patient's wife. She has called [facility] in [place] and left a message for Catering managerthe director. The secretary told her they do not have a weight limit. Per patient's wife, he does have Medicaid. His Medicaid number is [id number]. CSW also left message for director at [facility].  Charlynn CourtSarah Shavawn Stobaugh, CSW [phone]  4:07 pm Still no bed offers. Discussed potential to return to [facility]. Patient's wife agreeable if we have on other options. She wants CSW to call her if I get a call back from a closer facility tomorrow or after. CSW emailed her a SNF list and put on there which ones declined and which I've called. CSW called hospital liaison for [facility]. She will review clinical information to confirm they can take him back today.  Charlynn CourtSarah Rayson Rando, CSW [phone]  5:03 pm Patient does not have a safe discharge plan for today. [facility] has to order the patient a special mattress and will not get it today. They do not have the one he had before admission. Their hospital liaison did say that [facility] has been taking new referrals since Friday. CSW sent referral and will follow up with them and [facility] in the morning.  Charlynn CourtSarah Jaysha Lasure, CSW [phone]

## 2017-01-10 NOTE — Clinical Social Work Placement (Signed)
   CLINICAL SOCIAL WORK PLACEMENT  NOTE  Date:  01/10/2017  Patient Details  Name: Aris LotRobert Dedman MRN: [id number] Date of Birth: 01/05/1956  Clinical Social Work is seeking post-discharge placement for this patient at the Skilled  Nursing Facility level of care (*CSW will initial, date and re-position this form in  chart as items are completed):  Yes   Patient/family provided with [facility] Clinical Social Work Department's list of facilities offering this level of care within the geographic area requested by the patient (or if unable, by the patient's family).  Yes   Patient/family informed of their freedom to choose among providers that offer the needed level of care, that participate in Medicare, Medicaid or managed care program needed by the patient, have an available bed and are willing to accept the patient.  Yes   Patient/family informed of [facility]'s ownership interest in [facility] and [facility], as well as of the fact that they are under no obligation to receive care at these facilities.  PASRR submitted to EDS on 01/10/17     PASRR number received on       Existing PASRR number confirmed on 01/10/17     FL2 transmitted to all facilities in geographic area requested by pt/family on 01/10/17     FL2 transmitted to all facilities within larger geographic area on       Patient informed that his/her managed care company has contracts with or will negotiate with certain facilities, including the following:            Patient/family informed of bed offers received.  Patient chooses bed at       Physician recommends and patient chooses bed at      Patient to be transferred to   on  .  Patient to be transferred to facility by       Patient family notified on   of transfer.  Name of family member notified:        PHYSICIAN Please sign FL2     Additional Comment:    _______________________________________________ Margarito LinerSarah C Hommer Cunliffe, LCSW 01/10/2017, 11:11  AM

## 2017-01-11 LAB — GLUCOSE, CAPILLARY
GLUCOSE-CAPILLARY: 174 mg/dL — AB (ref 65–99)
GLUCOSE-CAPILLARY: 163 mg/dL — AB (ref 65–99)
GLUCOSE-CAPILLARY: 207 mg/dL — AB (ref 65–99)
GLUCOSE-CAPILLARY: 180 mg/dL — AB (ref 65–99)
Glucose-Capillary: 194 mg/dL — ABNORMAL HIGH (ref 65–99)
Glucose-Capillary: 212 mg/dL — ABNORMAL HIGH (ref 65–99)
Glucose-Capillary: 214 mg/dL — ABNORMAL HIGH (ref 65–99)

## 2017-01-11 MED ORDER — VITAL AF 1.2 CAL PO LIQD
1000.0000 mL | ORAL | Status: DC
  Administered 2017-01-11: 1000 mL
  Filled 2017-01-11 (×3): qty 1000

## 2017-01-11 NOTE — Progress Notes (Signed)
PROGRESS NOTE    Jordan LotRobert Villanueva  ZOX:[id number] DOB: 09/26/1955 DOA: 01/06/2017 PCP: System, Provider Not In    Brief Narrative:  61 year old male who presented with edema. 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 Apparently after his spinal cord injury patientwasdischarge to a acute long-term care facility, andthen to a nursing home.Over the last few days he was noted to have generalized, severe progressive edema,complicated by lethargy. Most of the history on admission was obtained from his wife at the bedside,patient unable to give detailed history.On the initial physical examination blood pressure 139/73, heart rate 88, oxygen saturation 87%.Dry mucous membranes, heart S1-S2 present rhythmic, lungs had decreased breath sounds bilaterally,positive rales,abdomen protuberant, no masses, nontender, PEG tube in place, lower extremity with 2+ pitting edema.Sodium 142, potassium 4.3, chloride 106, bicarbonate 30, glucose 223, BUN 58, creatinine 1.71,white count 9.2,  Hb 11.9, hematocrit 37.0, platelets 234,urine analysis negative for infection,arterial blood gas 7.43/49.7/80/35/97%.Chest x-ray with left rotation, poor inflation,bilateral interstitial infiltrates,small left pleural effusion,cardiomegaly.EKG 80 bpm, normal axis, normal intervals, poor R-wave progression.  Patient was admitted to hospital with working diagnosis of acute hypoxic respiratory failure due to cardiogenic pulmonary edema,due to decompensated acute on chronic diastolic heart failure.  Patient is waiting for placement at SNF.    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 Will continue b blockade with carvedilol.   2.Acute hypoxic respiratory failure due to cardiogenic pulmonary edema.clinically has resolved, will continue oxymetry monitoring, patient is chronically on supplemental 02 per [place].  3.Acute metabolic encephalopathy.Resolved and patient is at his baseline.  4.Acute kidney injury on chronic kidney diseasestage 3.Will need close follow up as outpatient of his renal function and electrolytes.   5.Type 2 diabetes mellitus.Capillary glucose 194, 174, 163, 207, 180. Continue sliding scale, plus basal insulin therapy.   6.Hypertension.Onamlodipine and hydralazine.  7.Quadriplegia and swallow dysfunction.Diet has been advanced per speech therapy, will continue aspiration precautions. DVT px.   8. Depression. Continue risperidone.   DVT prophylaxis:heparin Code Status:full Family Communication: Disposition Plan:   Consultants:    Procedures:    Antimicrobials:   Subjective: Patient feeling well, no dyspnea, no chest pain, no nausea or vomiting, diet has been advanced with aspiration precautions.   Objective: Vitals:   01/11/17 0155 01/11/17 0421 01/11/17 0753 01/11/17 1349  BP:  117/62 136/72 (!) 135/59  Pulse: 69 70 70 67  Resp: 18 18    Temp:  97.7 F (36.5 C)    TempSrc:  Oral    SpO2: 96% 96%    Weight:  (!) 181.9 kg (401 lb)    Height:        Intake/Output Summary (Last 24 hours) at 01/11/2017 1438 Last data filed at 01/11/2017 0853 Gross per 24 hour  Intake 1123 ml  Output 1450 ml  Net -327 ml   Filed Weights   01/09/17 1444 01/10/17 0438 01/11/17 0421  Weight: (!) 175.5 kg (387 lb) (!) 183.3 kg (404 lb) (!) 181.9 kg (401 lb)    Examination:   General: Not in pain  or dyspnea,  deconditioned Neurology: Awake and alert, non focal  E ENT: no pallor, no icterus, oral mucosa moist Cardiovascular: No JVD. S1-S2 present, rhythmic, no gallops, rubs, or murmurs. No lower extremity edema. Pulmonary: vesicular breath sounds bilaterally, adequate air movement, no wheezing, rhonchi or rales. Mild decreased breath sounds at the dependent zones Gastrointestinal. Abdomen flat, no organomegaly, non tender, no rebound or guarding Skin. No rashes Musculoskeletal: no joint deformities     Data Reviewed: I have personally reviewed following labs and imaging studies  CBC: Recent Labs  Lab 01/06/17 2113 01/07/17 0335  WBC 9.2 9.3  NEUTROABS 6.6 6.1  HGB 11.9* 11.9*  HCT 37.0* 37.5*  MCV 88.3 88.7  PLT 234 218   Basic Metabolic Panel: Recent Labs  Lab 01/06/17 2113 01/08/17 0253 01/09/17 0240 01/10/17 0426  NA 142 144 144 140  K 4.3 4.3 3.5 3.2*  CL 106 106 104 103  CO2 30 30 31 31   GLUCOSE 223* 128* 147* 170*  BUN 58* 68* 63* 60*  CREATININE 1.71* 1.91* 1.54* 1.43*  CALCIUM 10.3 9.9 9.6 9.5   GFR: Estimated Creatinine Clearance: 87.2 mL/min (A) (by C-G formula based on SCr of 1.43 mg/dL (H)). Liver Function Tests: Recent Labs  Lab 01/06/17 2113  AST 18  ALT 21  ALKPHOS 79  BILITOT 0.6  PROT 7.0  ALBUMIN 2.8*   No results for input(s): LIPASE, AMYLASE in the last 168 hours. No results for input(s): AMMONIA in the last 168 hours. Coagulation Profile: No results for input(s): INR, PROTIME in the last 168 hours. Cardiac Enzymes: No results for input(s): CKTOTAL, CKMB, CKMBINDEX, TROPONINI in the last 168 hours. BNP (last 3 results) No results for input(s): PROBNP in the last 8760 hours. HbA1C: No results for input(s): HGBA1C in the last 72 hours. CBG: Recent Labs  Lab 01/10/17 2045 01/11/17 0021 01/11/17 0418 01/11/17 0730 01/11/17 1137  GLUCAP 158* 194* 174* 163* 207*   Lipid Profile: No results for input(s): CHOL, HDL, LDLCALC, TRIG,  CHOLHDL, LDLDIRECT in the last 72 hours. Thyroid Function Tests: No results for input(s): TSH, T4TOTAL, FREET4, T3FREE, THYROIDAB in the last 72 hours. Anemia Panel: No results for input(s): VITAMINB12, FOLATE, FERRITIN, TIBC, IRON, RETICCTPCT in the last 72 hours.    Radiology Studies: I have reviewed all of the imaging during this hospital visit personally     Scheduled Meds: . amLODipine  10 mg Per Tube Daily  . aspirin  81 mg Oral Daily  . atorvastatin  20 mg Per Tube QPM  . calcium carbonate  1,250 mg Per Tube Q breakfast  . carvedilol  12.5 mg Per Tube BID WC  . famotidine  20 mg Per Tube BID  . feeding supplement (PRO-STAT SUGAR FREE 64)  60 mL Oral BID  . feeding supplement (VITAL AF 1.2 CAL)  1,000 mL Per Tube Q24H  . free water  200 mL Per Tube Q8H  . furosemide  40 mg Per Tube Daily  . gabapentin  400 mg Per Tube TID  . heparin  5,000 Units Subcutaneous Q8H  . hydrALAZINE  20 mg Per Tube Q8H  . insulin aspart  0-9 Units Subcutaneous Q4H  . insulin glargine  28 Units Subcutaneous QHS  . ondansetron  4 mg Intravenous Once  . risperiDONE  1 mg Oral BID  . sodium chloride flush  3 mL Intravenous Q12H   Continuous Infusions: . sodium chloride       LOS: 4 days  Yarelin Reichardt Gerome Apley, MD [facility] Pager [phone]

## 2017-01-11 NOTE — Progress Notes (Signed)
PT Cancellation Note  Patient Details Name: Jordan Villanueva MRN: [id number] DOB: 01/12/1956   Cancelled Treatment:    Reason Eval/Treat Not Completed: PT screened, no needs identified, will sign off. PTA, pt requires totalA at baseline; discussed need for total lift equipment for transfers with RN/NT. If PT note needed for SNF placement, please contact (pager # below).   Jordan Villanueva, PT, DPT Acute Rehab Services  Pager: [phone]  Malachy ChamberJaclyn L Katalia Choma 01/11/2017, 5:31 PM

## 2017-01-11 NOTE — Progress Notes (Signed)
  Speech Language Pathology Treatment: Dysphagia  Patient Details Name: Jordan LotRobert Vise MRN: [id number] DOB: 11/28/1955 Today's Date: 01/11/2017 Time: [id number] SLP Time Calculation (min) (ACUTE ONLY): 15 min  Assessment / Plan / Recommendation Clinical Impression  Pt seen with po's following FEES yesterday and initiation of Dys 2 texture, thin liquids. Proper positioning is vital given limited elevation of head of bed with the bariatric bed. Please use reverse Trendelenburg position, staff to assist pt in forward position to place pillows behind his back for more upright position. Dys 3 texture given for possible upgrade with consistent delayed coughs requiring cues to not verbalize while swallowing. Pt's aspiration risk is higher given limits of bariatric bed and pt requires total assist for feeding (unable to move arms). Recommend continue Dys 2, thin, straws allowed and pillows behind back- reiterated this with pt's RN. Continue ST.   HPI HPI: 61 year old male who presented with edema. 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 He is s/p C3-4 anterior cervical diskectomy and fusion. Apparently after his spinal cord injury patientwasdischarged to a acute long-term care facility, andthen to a nursing home.Over the last few days he was noted to have generalized, severe progressive edema,complicated by lethargy. Per care everywhere pt [facility] revealing penetration with thin, nectar and puree. Full report not available but exam limited due to overlying anatomy restricting view of pt's upper airway. Per notes pt work with SLP, planning to repeat FEES 12/16/16 however no report found; appears he was d/c prior to completing.      SLP Plan  Continue with current plan of care       Recommendations  Diet recommendations: Dysphagia 2 (fine chop);Thin  liquid Liquids provided via: Straw;Cup Medication Administration: Crushed with puree Supervision: Staff to assist with self feeding;Full supervision/cueing for compensatory strategies Compensations: Slow rate;Small sips/bites(sit upright; place pillows behind back) Postural Changes and/or Swallow Maneuvers: Seated upright 90 degrees                Oral Care Recommendations: Oral care BID Follow up Recommendations: Skilled Nursing facility SLP Visit Diagnosis: Dysphagia, pharyngeal phase (R13.13) Plan: Continue with current plan of care                       Royce MacadamiaLitaker, Donatello Kleve Willis 01/11/2017, 10:00 AM  Breck CoonsLisa Willis Lonell FaceLitaker M.Ed [employer] Pager [phone]

## 2017-01-11 NOTE — Progress Notes (Signed)
Nutrition Follow-up  DOCUMENTATION CODES:   Morbid obesity  INTERVENTION:   - Decrease Vital AF 1.2 from 40 mL/hr to 30 mL hr via PEG.  - Continue 60 mL Pro-stat BID  - Tube feeding regimen provides 1,064 kcals (73% of goal), 84 grams of protein, and 583 mL of water.  NUTRITION DIAGNOSIS:   Inadequate oral intake related to inability to eat as evidenced by NPO status. Progressing, pt now on Dysphagia 2 Diet  GOAL:   Patient will meet greater than or equal to 90% of their needs Met  MONITOR:   Labs, Weight trends, Skin, I & O's, TF tolerance  ASSESSMENT:   Jordan Villanueva is a 61 y.o. 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   Decrease TF to 30 mL/hr via PEG tube as pt is now eating.   Pt advanced on 11/12 from NPO to Dysphagia 2 diet. Per chart, pt ate 30% lunch on 11/12 and 75% breakfast on 11/13. Pt is glad to be able to eat by mouth again and feels that his appetite has increased. NT assists with feedings as pt is quadriplegic. Pt has dementia and was unable to remember what he ate for his meals. Pt to discharge to SNF when arrangements for his placement are made.   Spoke with SLP about pts swallowing abilities. He is beginning to eat more but still is not meeting his needs fully through po intake. We discussed decreasing his tube feed to wean him off in hopes that he'll increase po intake for his upcoming discharge to SNF.   Medications and labs reviewed.  Diet Order:  Diet - low sodium heart healthy DIET DYS 2 Room service appropriate? Yes; Fluid consistency: Thin  EDUCATION NEEDS:   No education needs have been identified at this time  Skin:  Skin Assessment: Reviewed RN Assessment  Last BM:  11/11  Height:   Ht Readings from Last 1 Encounters:  01/09/17  _0  (1.778 m)    Weight:   Wt Readings from Last 1 Encounters:  01/11/17 (!) 401 lb (181.9 kg)    Ideal Body Weight:  75.45 kg  BMI:  Body mass index is 57.54 kg/m.  Estimated Nutritional Needs:   Kcal:  1450-1650  Protein:  130-145 grams  Fluid:  > 1.5 [place] Dietetic Intern Pager: [phone] 01/11/2017 9:48 AM

## 2017-01-11 NOTE — Clinical Social Work Note (Addendum)
[facility] is unable to take patient back because they cannot provide the level of care he needs. [facility] reviewed the referral and also cannot take patient. Admissions coordinator at [facility] will review referral and call CSW back.  Charlynn CourtSarah Itzayana Pardy, CSW [phone]  1:14 pm No call back from [facility] yet and they have not responded on the hub. CSW called and left voicemail for admissions coordinator.  Charlynn CourtSarah Maybree Riling, CSW [phone]  2:25 pm Admissions coordinator at [facility] on a conference call. Did not leave another voicemail. CSW updated patient's wife. Will try again later.  Charlynn CourtSarah Kaneesha Constantino, CSW [phone]  2:54 pm [facility] has declined. [facility] in [place]'s weight limit is 300 lbs. CSW spoke with admissions coordinator at [facility] in [place]. She will review referral and call CSW back.  Charlynn CourtSarah Jilberto Vanderwall, CSW [phone]  5:18 pm [facility] is able to extend a bed offer. They are aware that patient is a quadriplegic, requires a bariatric bed, and has a peg tube. Patient's wife notified. She spoke with a CNA she knows who stated there is a facility in the old building at [facility] where they manage patient's that are total assist, paralyzed, etc. CSW spoke with the operators at [facility] who did not know of this program but said the home health program may know. CSW left a voicemail. Patient's wife updated. She will try to find out the name of the facility tonight.  Charlynn CourtSarah Rebekha Diveley, CSW [phone]

## 2017-01-12 DIAGNOSIS — I5033 Acute on chronic diastolic (congestive) heart failure: Secondary | ICD-10-CM

## 2017-01-12 DIAGNOSIS — S24101S Unspecified injury at T1 level of thoracic spinal cord, sequela: Secondary | ICD-10-CM

## 2017-01-12 LAB — GLUCOSE, CAPILLARY
GLUCOSE-CAPILLARY: 155 mg/dL — AB (ref 65–99)
GLUCOSE-CAPILLARY: 147 mg/dL — AB (ref 65–99)
Glucose-Capillary: 166 mg/dL — ABNORMAL HIGH (ref 65–99)
Glucose-Capillary: 169 mg/dL — ABNORMAL HIGH (ref 65–99)
Glucose-Capillary: 179 mg/dL — ABNORMAL HIGH (ref 65–99)

## 2017-01-12 MED ORDER — LORAZEPAM 1 MG PO TABS
1.0000 mg | ORAL_TABLET | Freq: Three times a day (TID) | ORAL | Status: DC | PRN
  Administered 2017-01-12: 1 mg via ORAL
  Filled 2017-01-12: qty 1

## 2017-01-12 NOTE — Discharge Summary (Signed)
Physician Discharge Summary  Jordan Villanueva ONG:[id number] DOB: [birth year] DOA: 01/06/2017  PCP: System, Provider Not In  Admit date: 01/06/2017 Discharge date: 01/12/2017  Admitted From: [facility] Discharge disposition: [facility]   Recommendations for Outpatient Follow-Up:   1. Will need CPAP daily at bedtime, 10 cm of water with 2-4 liters bleed in at night. 2. Will need close follow-up of renal function/electrolytes.  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  Carbohydrate-modified.     History of Present Illness:   Jordan Villanueva is an 61 y.o. 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 Patient was admitted to hospital with working diagnosis of acute hypoxic respiratory failure due to cardiogenic pulmonary edema,due to decompensated acute on chronic diastolic heart failure.  Hospital Course by Problem:   Principal problem:  Acute on chronic diastolic heart failure Clinically patient continues to be euvlemic. Continue oral furosemide to target negative fluid balance. Continue beta blockade with carvedilol.   Active problems:  Acute hypoxic respiratory failure due to cardiogenic pulmonary edema This clinically has resolved. Continue supplemental oxygen and nocturnal CPAP with settings noted above.  Acute metabolic encephalopathy Has underlying dementia with occasional restlessness. Patient reportedly is at his baseline.  Acute  kidney injury on chronic kidney diseasestage 3 Will need close follow up renal function/electrolytes as outpatient.   Type 2 diabetes mellitus with complications Continue Lantus and SSI .   Hypertension Continue amlodipine and hydralazine.  Quadriplegia and swallow dysfunction Diet has been advanced per speech therapy, will continue aspiration precautions. Continue DVT prophylaxis   Depression Continue risperidone.  Medical Consultants:    None.   Discharge Exam:   Vitals:   01/12/17 0650 01/12/17 1203  BP: (!) 142/67 122/61  Pulse: 68 65  Resp: 20 18  Temp: 98.2 F (36.8 C) 98 F (36.7 C)  SpO2: 100% 98%   Vitals:   01/11/17 1612 01/12/17 0500 01/12/17 0650 01/12/17 1203  BP: (!) 130/58  (!) 142/67 122/61  Pulse: 71  68 65  Resp:   20 18  Temp:   98.2 F (36.8 C) 98 F (36.7 C)  TempSrc:   Oral Oral  SpO2: 95%  100% 98%  Weight:  (!) 187.8 kg (414 lb)    Height:        General exam: Appears mildly restless, calling out into the hallway for "[employer] ". Respiratory system: Clear to auscultation. Respiratory effort normal. Cardiovascular system: S1 & S2 heard, RRR. No JVD,  rubs, gallops or clicks. No murmurs. Gastrointestinal system: Abdomen is obese and mildly softly distended. No organomegaly or masses felt. Normal bowel sounds heard. Central nervous system: Disoriented with paraplegia. Extremities: No clubbing,  or cyanosis. 3+ edema. Skin: No rashes, lesions or ulcers. Psychiatry: Mildly restless with a depressed affect.    The results of significant diagnostics from this hospitalization (including imaging, microbiology, ancillary and laboratory) are listed below for reference.     Procedures and Diagnostic Studies:   Dg Chest  2 View  Result Date: 01/06/2017 CLINICAL DATA:  Shortness of breath. EXAM: CHEST  2 VIEW COMPARISON:  Chest radiograph 12/14/2016. FINDINGS: Patient is rotated to the left. Anterior cervical spinal fusion hardware. Marked  cardiomegaly. Monitoring leads overlie the patient. Pulmonary vascular redistribution. Bilateral hazy pulmonary opacities. Small left pleural effusion. IMPRESSION: Findings suggestive of mild interstitial edema and possible left pleural effusion. Cardiomegaly. Limited exam. Electronically Signed   By: Annia Beltrew  Davis M.D.   On: 01/06/2017 21:55     Labs:   Basic Metabolic Panel: Recent Labs  Lab 01/06/17 2113 01/08/17 0253 01/09/17 0240 01/10/17 0426  NA 142 144 144 140  K 4.3 4.3 3.5 3.2*  CL 106 106 104 103  CO2 30 30 31 31   GLUCOSE 223* 128* 147* 170*  BUN 58* 68* 63* 60*  CREATININE 1.71* 1.91* 1.54* 1.43*  CALCIUM 10.3 9.9 9.6 9.5   GFR Estimated Creatinine Clearance: 88.9 mL/min (A) (by C-G formula based on SCr of 1.43 mg/dL (H)). Liver Function Tests: Recent Labs  Lab 01/06/17 2113  AST 18  ALT 21  ALKPHOS 79  BILITOT 0.6  PROT 7.0  ALBUMIN 2.8*   CBC: Recent Labs  Lab 01/06/17 2113 01/07/17 0335  WBC 9.2 9.3  NEUTROABS 6.6 6.1  HGB 11.9* 11.9*  HCT 37.0* 37.5*  MCV 88.3 88.7  PLT 234 218   CBG: Recent Labs  Lab 01/11/17 2311 01/12/17 0029 01/12/17 0415 01/12/17 0732 01/12/17 1114  GLUCAP 212* 179* 155* 147* 169*   Microbiology Recent Results (from the past 240 hour(s))  MRSA PCR Screening     Status: None   Collection Time: 01/07/17  5:26 PM  Result Value Ref Range Status   MRSA by PCR NEGATIVE NEGATIVE Final    Comment:        The GeneXpert MRSA Assay (FDA approved for NASAL specimens only), is one component of a comprehensive MRSA colonization surveillance program. It is not intended to diagnose MRSA infection nor to guide or monitor treatment for MRSA infections.      Discharge Instructions:   Discharge Instructions    (HEART FAILURE PATIENTS) Call MD:  Anytime you have any of the following symptoms: 1) 3 pound weight gain in 24 hours or 5 pounds in 1 week 2) shortness of breath, with or without a dry hacking cough 3) swelling in  the hands, feet or stomach 4) if you have to sleep on extra pillows at night in order to breathe.   Complete by:  As directed    Call MD for:  extreme fatigue   Complete by:  As directed    Diet - low sodium heart healthy   Complete by:  As directed    Discharge instructions   Complete by:  As directed    Please follow up with primary care in 7 days.   Increase activity slowly   Complete by:  As directed      Allergies as of 01/12/2017   No Known Allergies     Medication List    TAKE these medications   amLODipine 10 MG tablet Commonly known as:  NORVASC Place 10 mg daily into feeding tube.   aspirin EC 81 MG tablet Give 1 tablet daily via G-Tube   atorvastatin 20 MG tablet Commonly known as:  LIPITOR Place 20 mg every evening into feeding tube.   calcium carbonate 1250 MG capsule Place 1,250 mg daily into feeding tube.   carvedilol 12.5 MG tablet Commonly known as:  COREG  Place 12.5 mg 2 (two) times daily with a meal into feeding tube.   cholecalciferol 1000 units tablet Commonly known as:  VITAMIN D Give 1 tablet via G-tube daily   cyanocobalamin 1000 MCG tablet Place 1,000 mcg daily into feeding tube.   famotidine 20 MG tablet Commonly known as:  PEPCID Place 20 mg 2 (two) times daily into feeding tube.   furosemide 40 MG tablet Commonly known as:  LASIX Place 40 mg daily into feeding tube.   gabapentin 400 MG capsule Commonly known as:  NEURONTIN Place 400 mg 3 (three) times daily into feeding tube.   heparin 5000 UNIT/ML injection Inject 5,000 Units every 8 (eight) hours into the skin.   HUMALOG 100 UNIT/ML injection Generic drug:  insulin lispro Inject as per sliding scale subcutaneously every 6 hours 200 - 250 = 2 units 251 - 300 = 4 units 301 - 400 = 6 units Greater than 400 call MD   hydrALAZINE 10 MG tablet Commonly known as:  APRESOLINE Place 20 mg 3 (three) times daily into feeding tube.   LANTUS 100 UNIT/ML injection Generic drug:   insulin glargine Inject 28 Units at bedtime into the skin.   multivitamin tablet Place 1 tablet daily into feeding tube.   NUTRITIONAL DRINK Liqd Administer Sugar Free Med pass per GT via Pump.  Rate 5550ml/hr for 24 hours a day   oxyCODONE 5 MG immediate release tablet Commonly known as:  ROXICODONE Place 1 tablet (5 mg total) every 6 (six) hours as needed into feeding tube for severe pain.   polyethylene glycol packet Commonly known as:  MIRALAX / GLYCOLAX Place 17 g daily into feeding tube.   risperiDONE 1 MG tablet Commonly known as:  RISPERDAL Place 1 mg 2 (two) times daily into feeding tube.   senna 8.6 MG tablet Commonly known as:  SENOKOT Place 2 tablets daily into feeding tube.   UNABLE TO FIND Enternal feed order every 12 hours Bolus with 50 ml of water every 2 hours for hydration and tube patency.  Flush every shift with 30-60 ml water before and after meds, before initiating feedings or when there is an interruption of feeding to maintain tube patency.  Flush every shift with 5-2810ml water between each medication       Contact information for follow-up providers    Primary Care Follow up in 1 week(s).            Contact information for after-discharge care    Destination    [facility] .   Service:  Skilled Nursing Contact information: [address] [place] [phone]                   Time coordinating discharge: 35 minutes.  Signed:  Damaya Channing  Pager [phone] [facility] 01/12/2017, 2:56 PM

## 2017-01-12 NOTE — Care Management Important Message (Signed)
Important Message  Patient Details  Name: Jordan Villanueva MRN: [id number] Date of Birth: 12/01/1955   Medicare Important Message Given:  Yes    [name] Stefan ChurchBratton 01/12/2017, 12:44 PM

## 2017-01-12 NOTE — Clinical Social Work Note (Addendum)
Patient's wife has accepted bed offer from [facility] in [place]. They are able to provide a private room. CSW left voicemail for admissions coordinator regarding ordering a bariatric bed and cpap machine. Awaiting call back.  Charlynn CourtSarah Gaylon Melchor, CSW [phone]  11:39 am [facility] ordered the [facility]ric bed yesterday afternoon. Admissions coordinator will review FL2 for cpap settings to make sure they look correct to order machine. She will call patient's wife regarding a time to do paperwork at the facility.  Charlynn CourtSarah Amjad Fikes, CSW [phone]  2:02 pm Patient's bed has been delivered. CSW received cpap settings from respiratory therapist: Cpap 10 sonometers of water with 2-4 L bleed in at night. This needs to be documented in the discharge summary. CSW paged MD to notify. Transport can be arranged once discharge orders and summary are in.  Charlynn CourtSarah Kenshawn Maciolek, CSW [phone]

## 2017-01-12 NOTE — Progress Notes (Signed)
  Speech Language Pathology Treatment: Dysphagia  Patient Details Name: Jordan Villanueva MRN: [id number] DOB: 09/23/1955 Today's Date: 01/12/2017 Time: [id number] SLP Time Calculation (min) (ACUTE ONLY): 20 min  Assessment / Plan / Recommendation Clinical Impression  Consistent cough following thin water via straw mitigated with removal of straw. No cough with Dys 3 solid although continue Dys 2 until confusion and hallucinations clear. Recommend thin liquids via cup, small sips, use pillows behind back to obtain more upright trunk position.    HPI HPI: 61 year old male who presented with edema. 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 He is s/p C3-4 anterior cervical diskectomy and fusion. Apparently after his spinal cord injury patientwasdischarged to a acute long-term care facility, andthen to a nursing home.Over the last few days he was noted to have generalized, severe progressive edema,complicated by lethargy. Per care everywhere [facility] revealing penetration with thin, nectar and puree. Full report not available but exam limited due to overlying anatomy restricting view of pt's upper airway. Per notes pt work with SLP, planning to repeat FEES 12/16/16 however no report found; appears he was d/c prior to completing.      SLP Plan  Continue with current plan of care       Recommendations  Diet recommendations: Dysphagia 2 (fine chop);Thin liquid Liquids provided via: Straw;No straw Medication Administration: Crushed with puree Supervision: Staff to assist with self feeding;Full supervision/cueing for compensatory strategies Compensations: Slow rate;Small sips/bites Postural Changes and/or Swallow Maneuvers: Seated upright 90 degrees                Oral Care Recommendations: Oral care BID Follow up Recommendations: Skilled  Nursing facility SLP Visit Diagnosis: Dysphagia, pharyngeal phase (R13.13) Plan: Continue with current plan of care                       Royce MacadamiaLitaker, Jadae Steinke Willis 01/12/2017, 2:03 PM  Breck CoonsLisa Willis Lonell FaceLitaker M.Ed [employer] Pager [phone]

## 2017-01-12 NOTE — Clinical Social Work Placement (Signed)
   CLINICAL SOCIAL WORK PLACEMENT  NOTE  Date:  01/12/2017  Patient Details  Name: Jordan Villanueva MRN: [id number] Date of Birth: 03/12/1955  Clinical Social Work is seeking post-discharge placement for this patient at the Skilled  Nursing Facility level of care (*CSW will initial, date and re-position this form in  chart as items are completed):  Yes   Patient/family provided with [facility] Clinical Social Work Department's list of facilities offering this level of care within the geographic area requested by the patient (or if unable, by the patient's family).  Yes   Patient/family informed of their freedom to choose among providers that offer the needed level of care, that participate in Medicare, Medicaid or managed care program needed by the patient, have an available bed and are willing to accept the patient.  Yes   Patient/family informed of [facility]'s ownership interest in [facility] and [facility], as well as of the fact that they are under no obligation to receive care at these facilities.  PASRR submitted to EDS on 01/10/17     PASRR number received on       Existing PASRR number confirmed on 01/10/17     FL2 transmitted to all facilities in geographic area requested by pt/family on 01/10/17     FL2 transmitted to all facilities within larger geographic area on       Patient informed that his/her managed care company has contracts with or will negotiate with certain facilities, including the following:        Yes   Patient/family informed of bed offers received.  [facility]     Physician recommends and patient chooses bed at      [facility].  Patient to be transferred to facility by [facility]     Patient family notified on 01/12/17 of transfer.  Name of family member notified:  Sherrlyn Hockebra Thrun     PHYSICIAN Please prepare prescriptions     Additional Comment:     _______________________________________________ Margarito LinerSarah C Takerra Lupinacci, LCSW 01/12/2017, 3:16 PM

## 2017-01-12 NOTE — Clinical Social Work Note (Signed)
CSW facilitated patient discharge including contacting patient family and facility to confirm patient discharge plans. Clinical information faxed to facility and family agreeable with plan. CSW arranged ambulance transport via [facility]. RN to call report prior to discharge [phone]. Ask for the D hall nurse. Patient will go to room 125.).  CSW will sign off for now as social work intervention is no longer needed. Please consult us again if new needs arise.  Charlynn CourtSarah Sekai Gitlin, CSW [phone]

## 2017-01-22 ENCOUNTER — Encounter: Payer: Self-pay | Admitting: Adult Health

## 2017-01-22 DIAGNOSIS — K219 Gastro-esophageal reflux disease without esophagitis: Secondary | ICD-10-CM | POA: Insufficient documentation

## 2017-01-22 DIAGNOSIS — E785 Hyperlipidemia, unspecified: Secondary | ICD-10-CM

## 2017-01-22 DIAGNOSIS — I5032 Chronic diastolic (congestive) heart failure: Secondary | ICD-10-CM | POA: Insufficient documentation

## 2017-01-22 DIAGNOSIS — K5909 Other constipation: Secondary | ICD-10-CM | POA: Insufficient documentation

## 2017-01-22 DIAGNOSIS — E1169 Type 2 diabetes mellitus with other specified complication: Secondary | ICD-10-CM | POA: Insufficient documentation

## 2017-05-24 DIAGNOSIS — I517 Cardiomegaly: Secondary | ICD-10-CM | POA: Diagnosis not present

## 2017-05-24 DIAGNOSIS — R600 Localized edema: Secondary | ICD-10-CM | POA: Diagnosis not present

## 2017-07-30 DEATH — deceased

## 2018-08-26 IMAGING — CT CT ABDOMEN W/O CM
2 of 4 series · 15 of 46 positions shown, 17 images · non-contrast
Comparison: CT abdomen pelvis - 01/07/2012

CLINICAL DATA: Evaluate anatomy for potential percutaneous
gastrostomy tube placement.

EXAM:
CT ABDOMEN WITHOUT CONTRAST
TECHNIQUE: Multidetector CT imaging of the abdomen was performed following the
standard protocol without IV contrast.

[Series 3: abd/ pelvis 5.0 i30f 2 · axial · 0.98mm/px · z∈[+1180,+1510]mm · 12 of 72 slices shown, 14 images]
[im 3/72  soft-tissue]
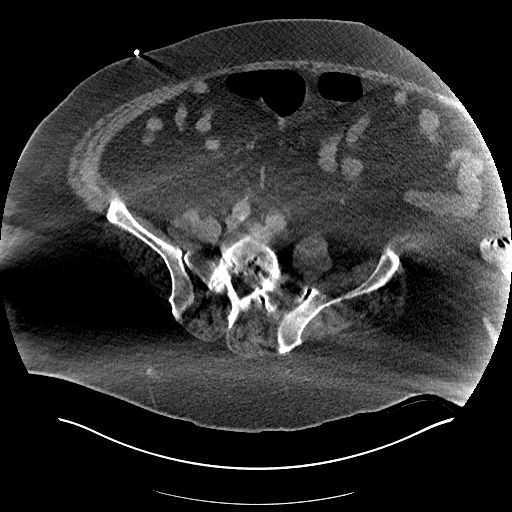
[im 3/72  bone]
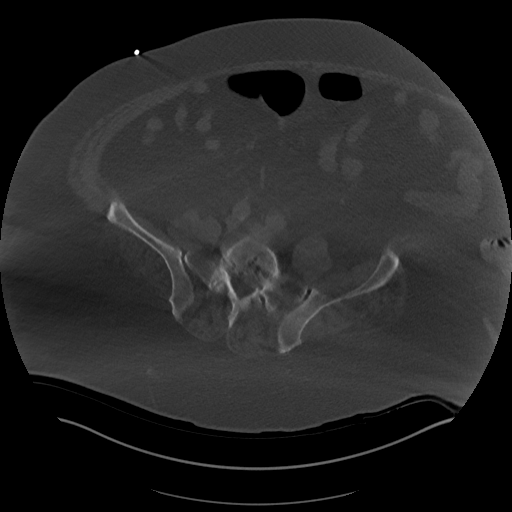
[im 9/72  soft-tissue]
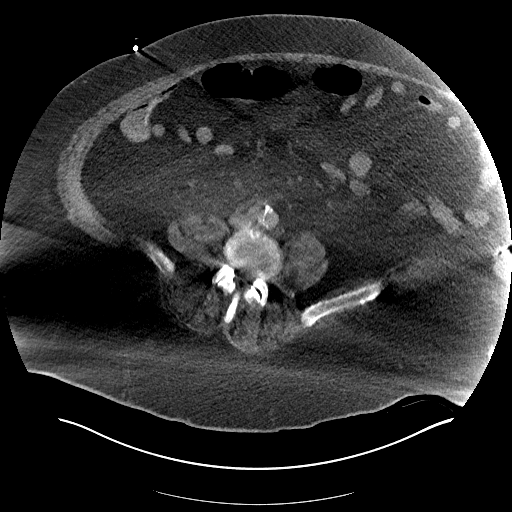
[im 15/72  soft-tissue]
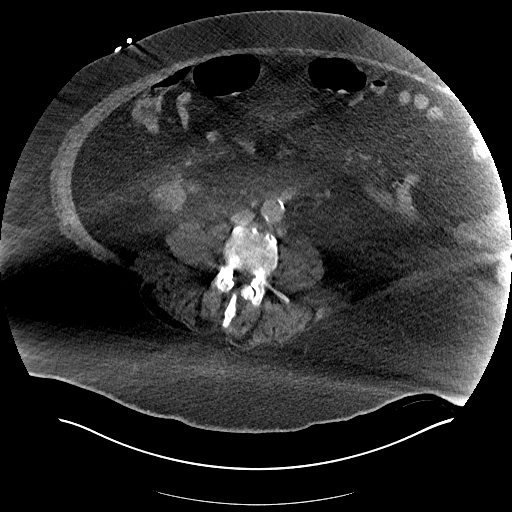
[im 21/72  soft-tissue]
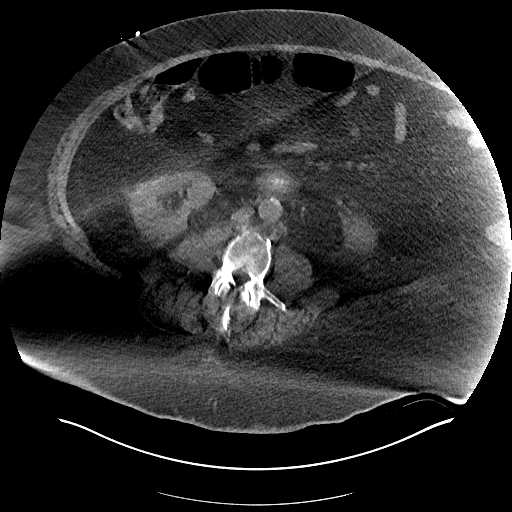
[im 27/72  soft-tissue]
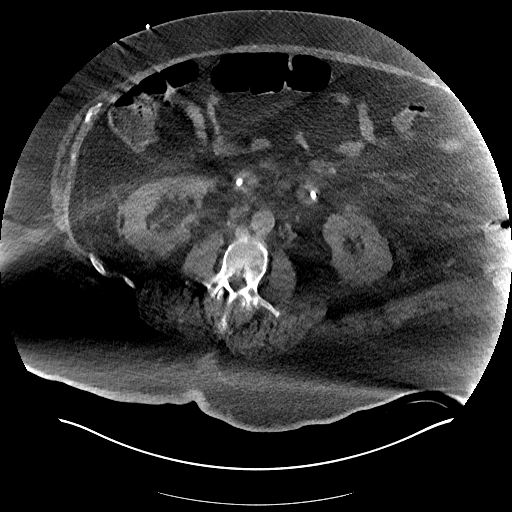
[im 33/72  soft-tissue]
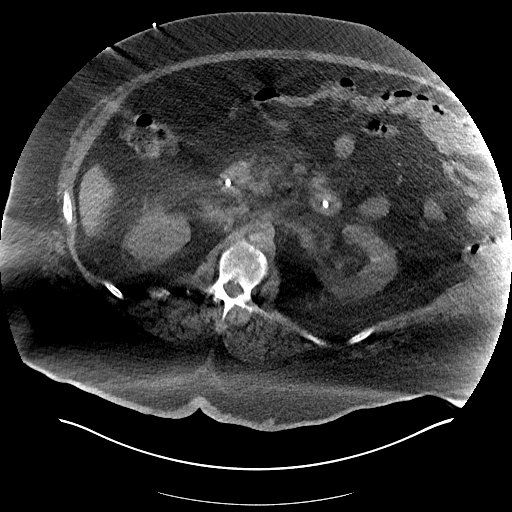
[im 39/72  soft-tissue]
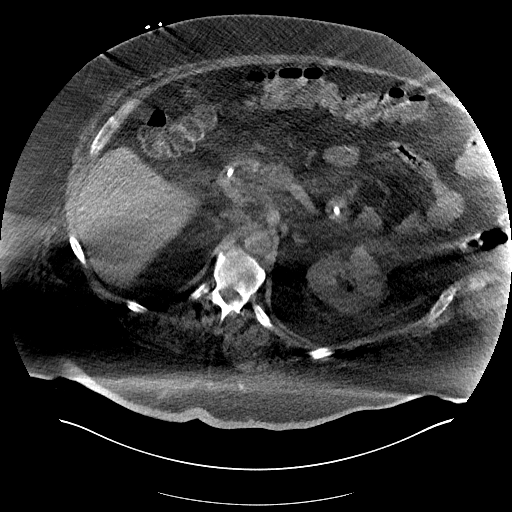
[im 45/72  soft-tissue]
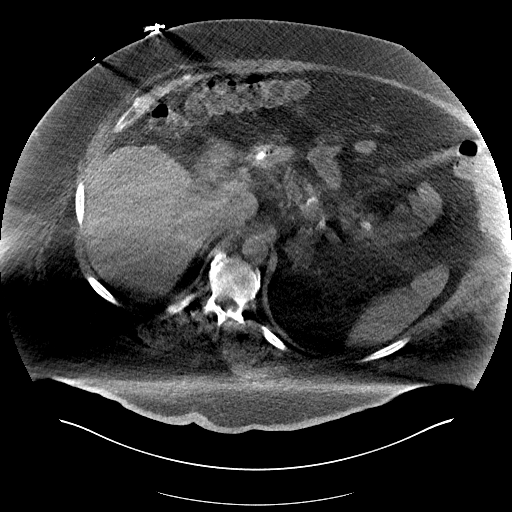
[im 51/72  soft-tissue]
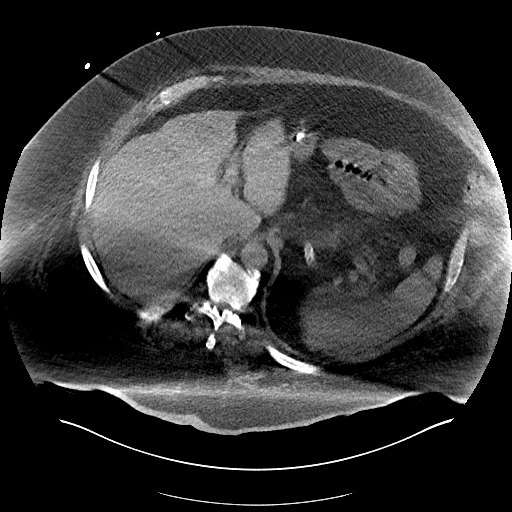
[im 51/72  bone]
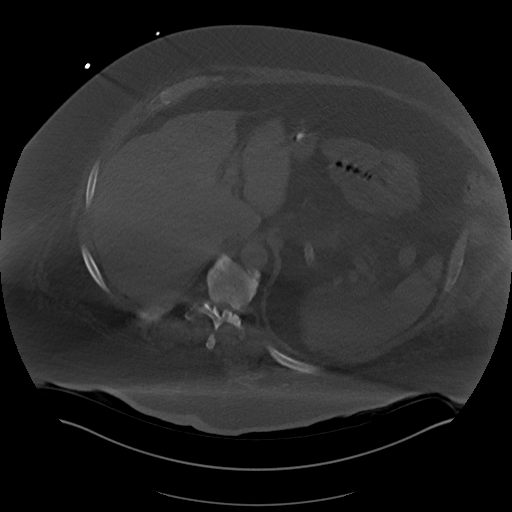
[im 57/72  soft-tissue]
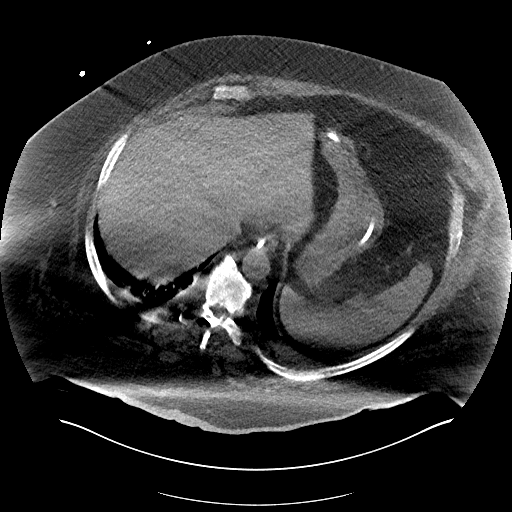
[im 63/72  soft-tissue]
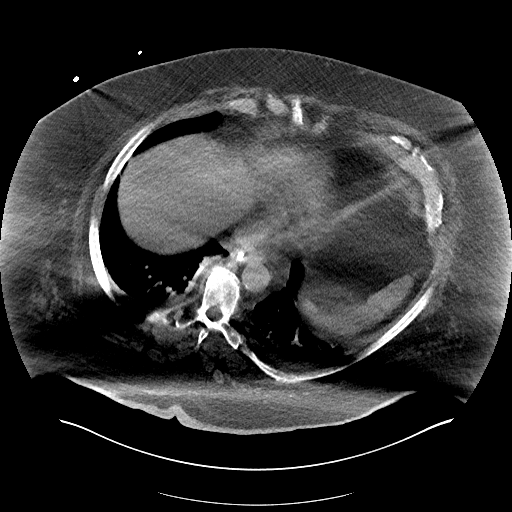
[im 69/72  soft-tissue]
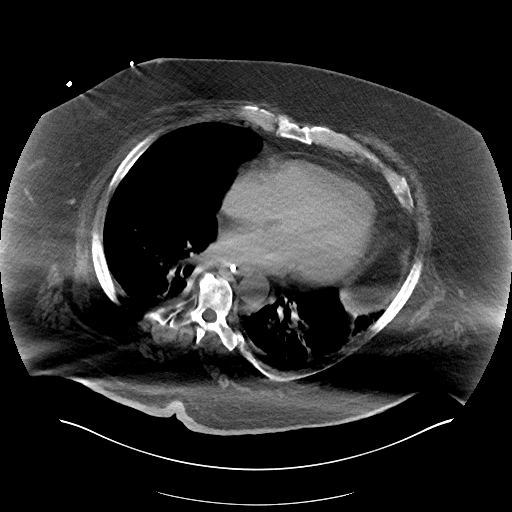

[Series 6: cor st · coronal · 0.70mm/px · 3 of 142 slices shown]
[im 48/142  soft-tissue]
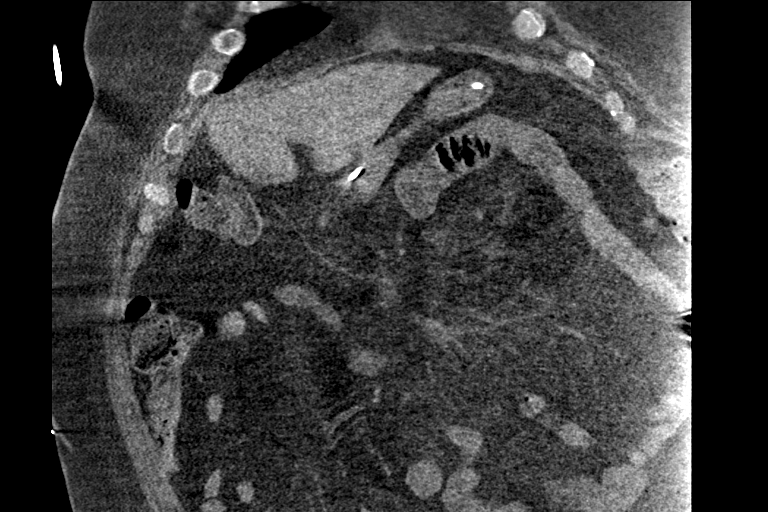
[im 63/142  soft-tissue]
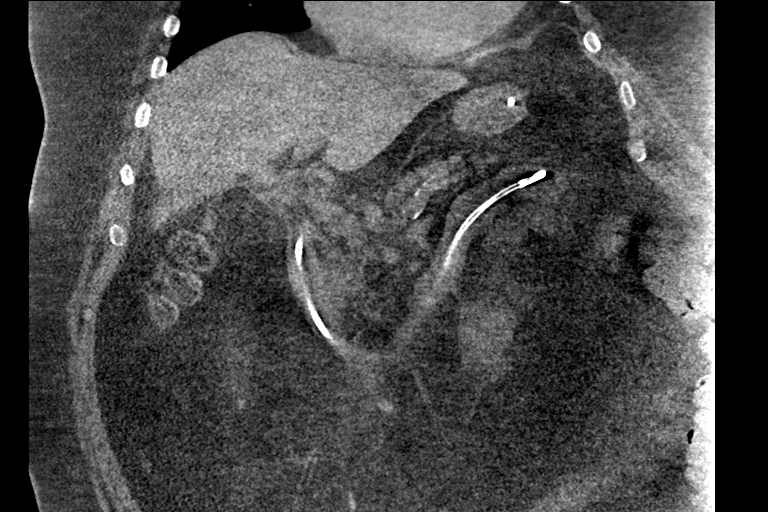
[im 79/142  soft-tissue]
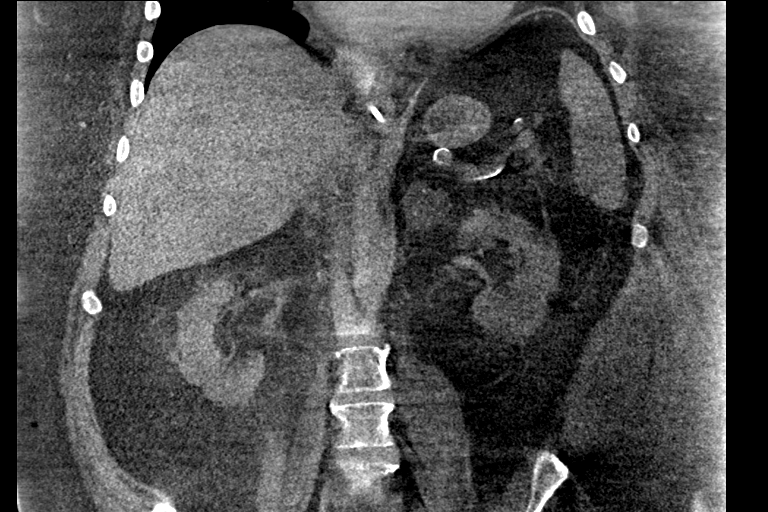

[15 of 46 positions shown; findings below may reference images not displayed]

FINDINGS: The lack of intravenous contrast limits the ability to evaluate
solid abdominal organs. Examination is further degraded secondary to
patient body habitus and quantum mottle artifact.

Lower chest: Minimal dependent subpleural atelectasis. No discrete
focal airspace opacities.

Borderline cardiomegaly.  No pericardial effusion.

Hepatobiliary: Normal hepatic contour. Post cholecystectomy. No
ascites.

Pancreas: Normal appearance of the pancreas

Spleen: Normal appearance of the spleen. Note is made of a small
splenule.

Adrenals/Urinary Tract: Normal noncontrast appearance the bilateral
kidneys. No renal stones. No urinary obstruction. Note is made of an
approximately 3.4 x 3.0 cm hypoattenuating (-46 Hounsfield unit)
left-sided adrenal myelolipoma, similar to the [DATE] examination.
Normal noncontrast appearance of the right adrenal gland.

Stomach/Bowel: The anterior aspect of the mid body of the stomach is
well apposed against the ventral wall of the abdomen without
interposed liver or colon.

Enteric tube tip terminates regional to the DJJ. Apparent left
lateral abdominal hernia containing portion of the descending colon
(image 66, series 3), incompletely imaged. Nonobstructive bowel gas
pattern.

No pneumoperitoneum, pneumatosis or portal venous gas.

Vascular/Lymphatic: Minimal amount of atherosclerotic plaque within
a normal caliber abdominal aorta.

No bulky retroperitoneal, mesenteric, pelvic or inguinal
lymphadenopathy.

Other: Left lateral abdominal wall hernia, incompletely evaluated.

Musculoskeletal: Severe DDD of L-S1 with disc space height loss,
endplate irregularity and sclerosis. Stigmata of DISH with the
caudal aspect of the thoracic spine.
IMPRESSION: 1. Gastric anatomy amenable to attempted percutaneous gastrostomy
tube placement as clinically indicated.
2. Suspected left lateral abdominal wall hernia containing a portion
of the descending colon, incompletely imaged. No definite evidence
of enteric obstruction.
3. Benign left adrenal myelolipoma, grossly unchanged compared to
the [DATE] examination.
4.  Aortic Atherosclerosis ([id number]).

## 2018-08-27 IMAGING — DX DG ABD PORTABLE 1V
1 series · 1 of 1 positions shown · non-contrast
Comparison: None.

CLINICAL DATA: NG tube placement

EXAM:
PORTABLE ABDOMEN - 1 VIEW

[abdomen kub]
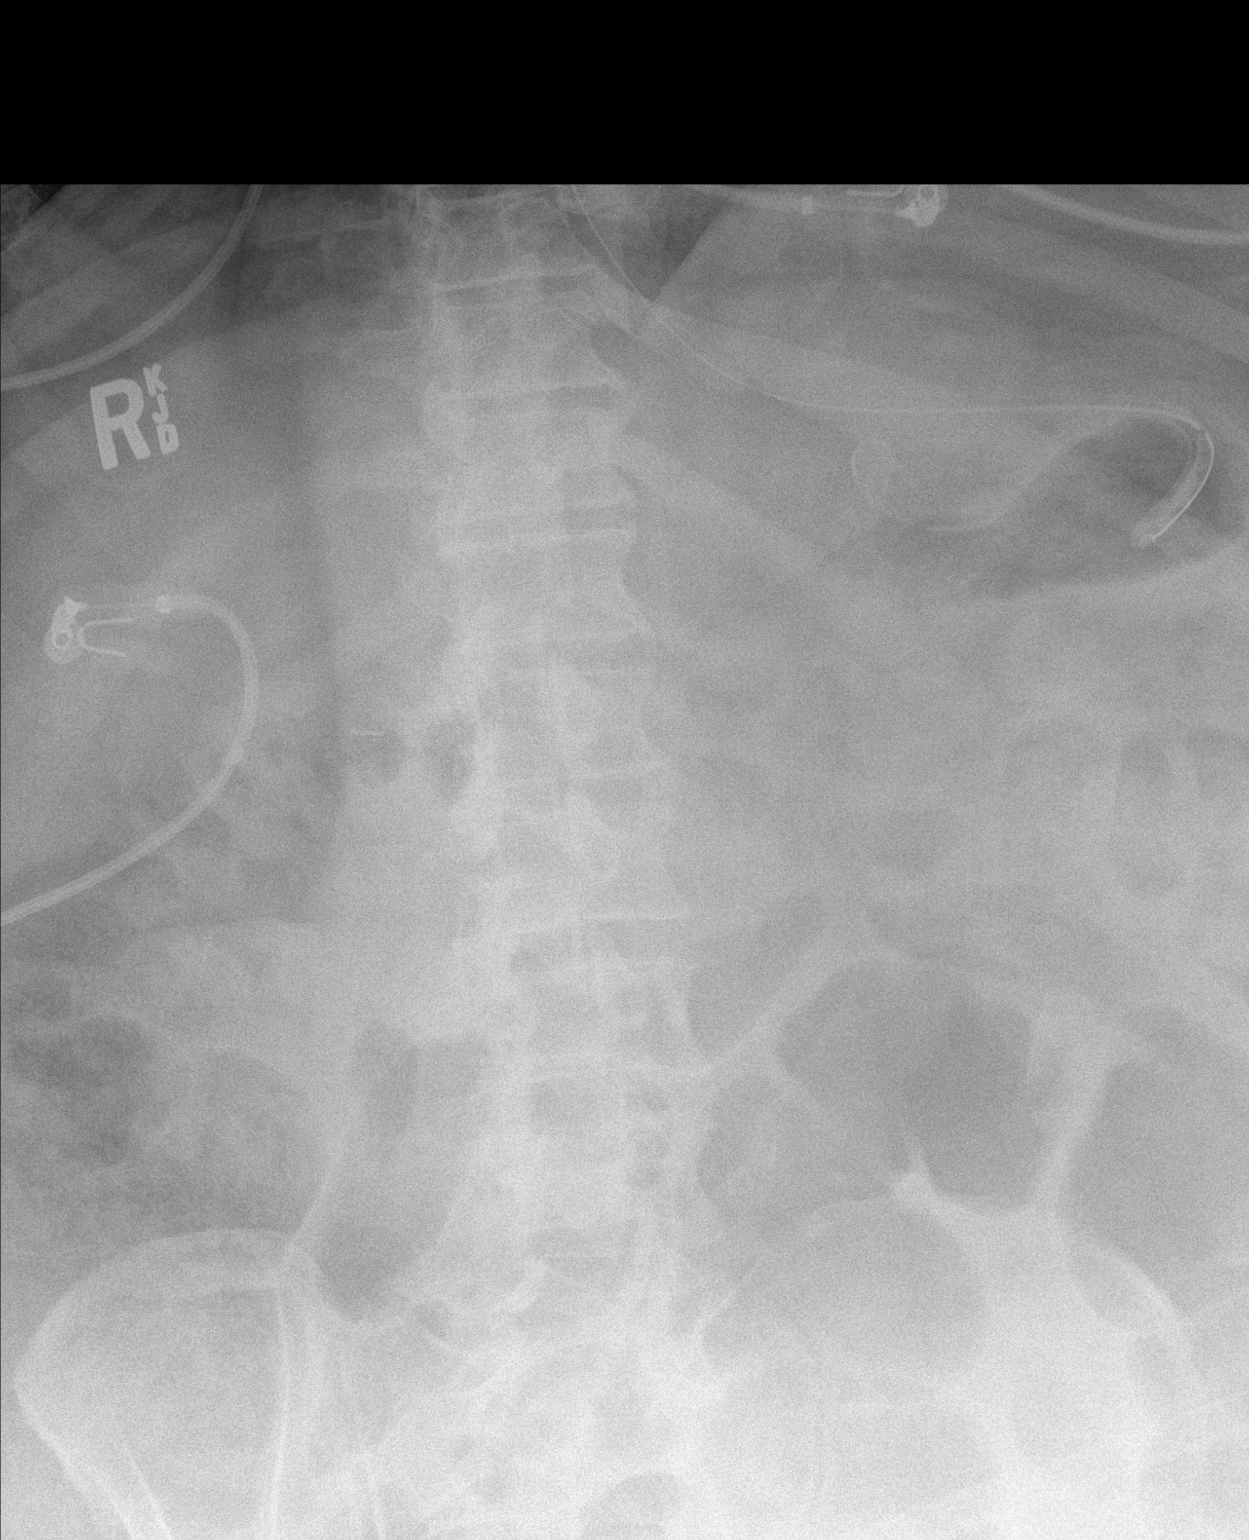

[1 of 1 positions shown; findings below may reference images not displayed]

FINDINGS: The tip and side port of a gastric tube are seen in the expected
location of the gastric fundus and body. Scattered gas containing
small and large bowel loops with moderate amount of stool in the
right colon. No free air. No radio-opaque calculi or other
significant radiographic abnormality are seen.
IMPRESSION: The tip and side port of a gastric tube are seen within the expected
location of the gastric fundus and proximal body.

## 2018-09-19 IMAGING — DX DG CHEST 1V PORT
1 series · 1 of 1 positions shown · non-contrast
Comparison: 01/06/2017

CLINICAL DATA: Dyspnea.

EXAM:
PORTABLE CHEST 1 VIEW

[chest]
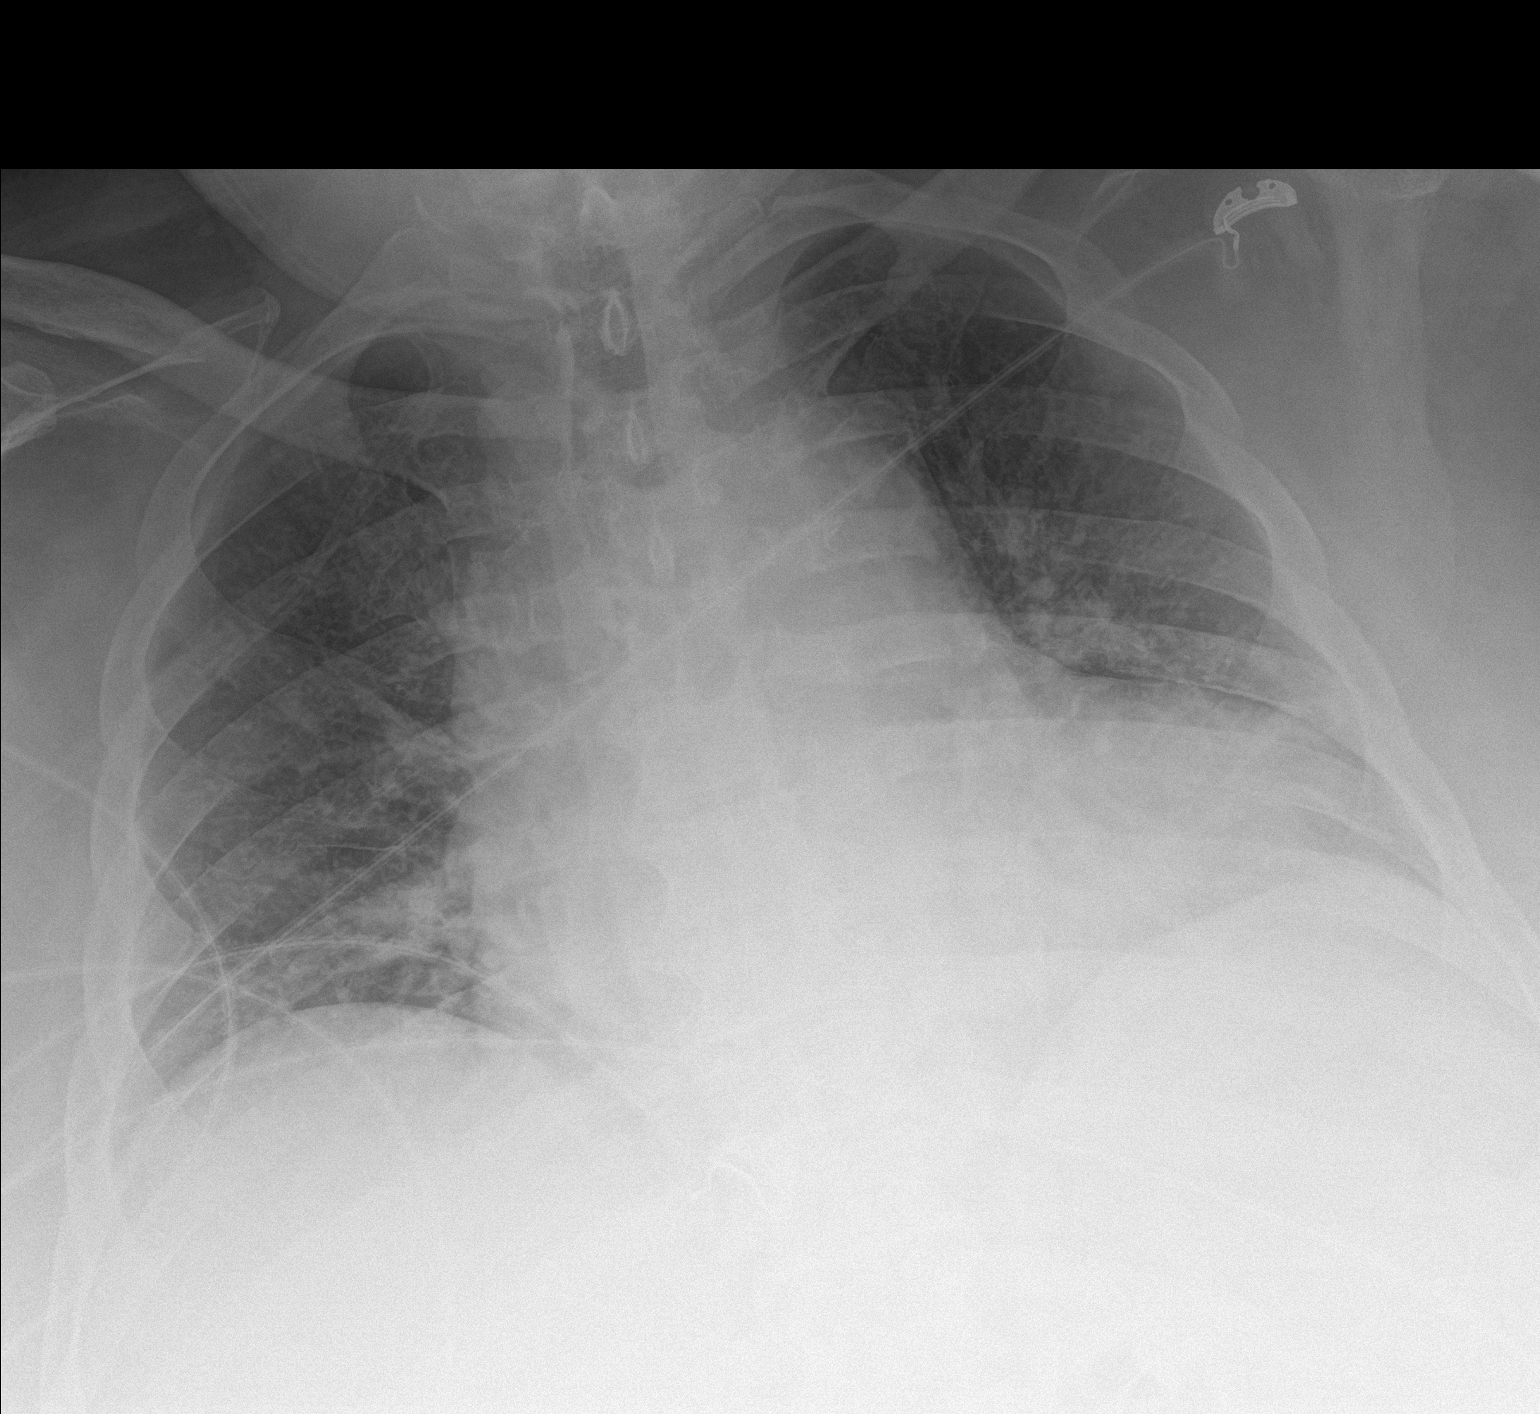

[1 of 1 positions shown; findings below may reference images not displayed]

FINDINGS: Persistently enlarged cardiac silhouette. Mediastinal contours
appear intact.

Interstitial pulmonary edema. Streaky opacities in bilateral lung
bases may represent atelectasis versus airspace consolidation.
Likely bilateral small pleural effusions. Low lung volume.

Osseous structures are without acute abnormality. Soft tissues are
grossly normal.
IMPRESSION: Enlarged heart.

Interstitial pulmonary edema with small bilateral pleural effusions.

Streaky airspace opacities in bilateral lung bases may represent
atelectasis versus airspace consolidation.
